# Patient Record
Sex: Female | Born: 1995
Health system: Southern US, Community
[De-identification: ages and names within clinical notes are randomized; demographics above are authoritative.]

## PROBLEM LIST (undated history)

## (undated) DIAGNOSIS — Z803 Family history of malignant neoplasm of breast: Secondary | ICD-10-CM

## (undated) DIAGNOSIS — F419 Anxiety disorder, unspecified: Secondary | ICD-10-CM

## (undated) DIAGNOSIS — D649 Anemia, unspecified: Secondary | ICD-10-CM

## (undated) DIAGNOSIS — F32A Depression, unspecified: Secondary | ICD-10-CM

## (undated) DIAGNOSIS — T7840XA Allergy, unspecified, initial encounter: Secondary | ICD-10-CM

## (undated) HISTORY — PX: NO PAST SURGERIES: SHX2092

## (undated) HISTORY — DX: Family history of malignant neoplasm of breast: Z80.3

## (undated) HISTORY — DX: Depression, unspecified: F32.A

## (undated) HISTORY — DX: Anemia, unspecified: D64.9

## (undated) HISTORY — DX: Allergy, unspecified, initial encounter: T78.40XA

## (undated) HISTORY — DX: Anxiety disorder, unspecified: F41.9

---
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
## 2015-01-19 NOTE — H&P (Signed)
L&D Evaluation:  History:  HPI Late entry from 1315  Patient is an 18yo @ 37.3 by LMP c/w 2nd trimester US with EDC of 11/30/14. Presents today to L&D with complaints of LOF.  She felt a large gush run down her leg around 11am.  Some contractions, not painful.  No VB and +FM.    Pregnancy Issues: 1. late to prenatal care @ 22w 2. + Chlamydia with neg TOC, and neg APTIMA @ 36w 3. Teen pregnancy 4. O+ RI VNI GBS neg, Hep b neg, HIV neg, RPR NR.   Presents with leaking fluid   Patient's Medical History No Chronic Illness   Patient's Surgical History none   Medications Pre Natal Vitamins   Allergies NKDA   Social History none   Family History Non-Contributory   ROS:  ROS All systems were reviewed.  HEENT, CNS, GI, GU, Respiratory, CV, Renal and Musculoskeletal systems were found to be normal.   Exam:  Vital Signs stable   Urine Protein negative dipstick   General no apparent distress   Mental Status clear   Chest clear   Heart normal sinus rhythm   Abdomen gravid, non-tender   Estimated Fetal Weight Average for gestational age   Fetal Position cephalic by leopolds   Back no CVAT   Edema no edema   Reflexes 2+   Clonus negative   Pelvic 3/70/-2, posterior and soft   Mebranes Ruptured, nitrazine + and clear fluid visible   Description clear   FHT normal rate with no decels, 140 mod + accels no decels   Ucx q ~5810min   Impression:  Impression PROM   Plan:  Comments 18yo G1P0 @ 37.3 with PROM  1. Admit to L&D for expectant management 2. Observe for ~4hrs after ROM to see if patient enters labor spontaneously, if not will start pitocin. 3. GBS negative 4. VZ non-immune, needs Varivax prior to discharge 5. light diet, IVF 6. continuous toco/efm   Electronic Signatures: Ward, Elenora Fenderhelsea C (MD)  (Signed 03-Mar-16 16:02)  Authored: L&D Evaluation   Last Updated: 03-Mar-16 16:02 by Ward, Elenora Fenderhelsea C (MD)

## 2015-10-18 IMAGING — CR DG CHEST 1V
1 series · 1 of 1 positions shown · non-contrast
Comparison: None.

CLINICAL DATA: Shortness of breath.  Cough.  Pregnant patient.

EXAM:
CHEST  1 VIEW

[w chest pa]
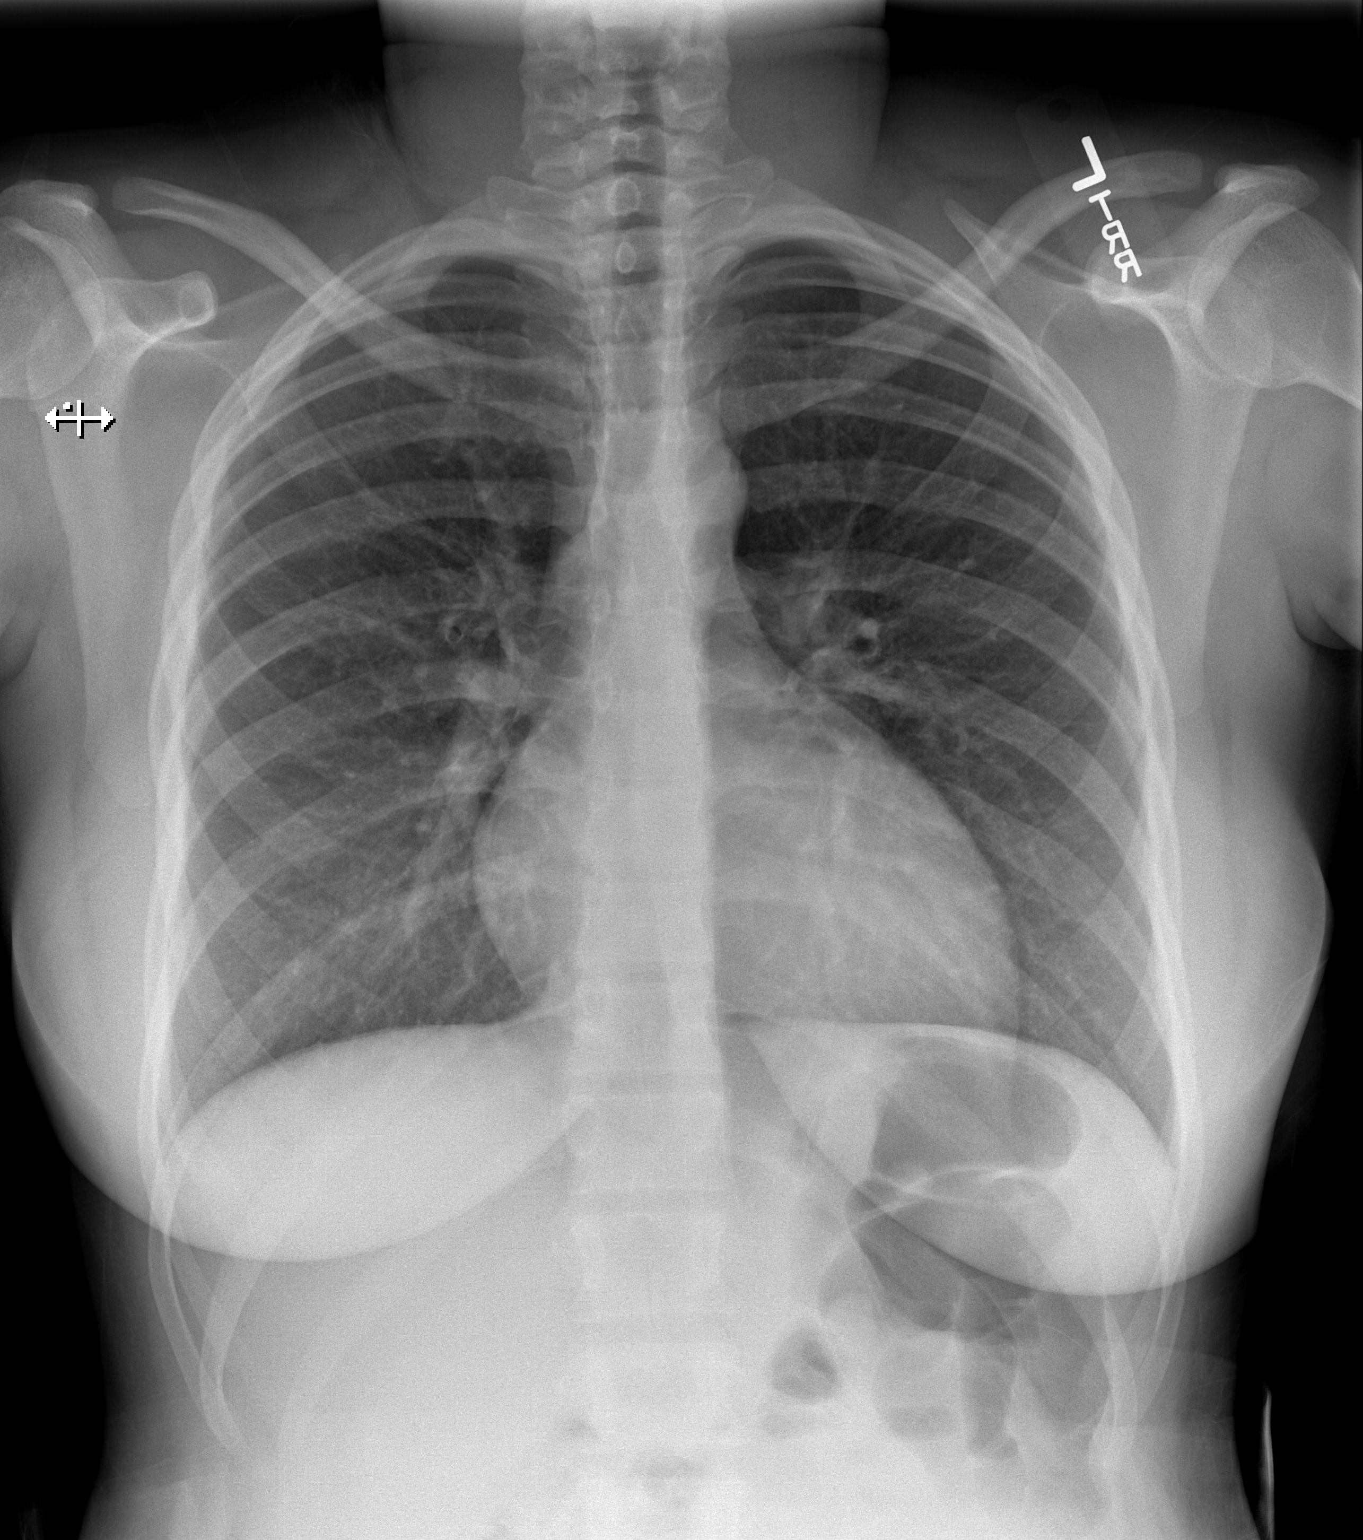

[1 of 1 positions shown; findings below may reference images not displayed]

FINDINGS: The cardiomediastinal contours are normal. The lungs are clear.
Pulmonary vasculature is normal. No consolidation, pleural effusion,
or pneumothorax. No acute osseous abnormalities are seen.
IMPRESSION: No acute pulmonary process.

## 2017-08-10 ENCOUNTER — Encounter: Payer: Self-pay | Admitting: Maternal Newborn

## 2017-08-10 ENCOUNTER — Ambulatory Visit (INDEPENDENT_AMBULATORY_CARE_PROVIDER_SITE_OTHER): Payer: BLUE CROSS/BLUE SHIELD | Admitting: Maternal Newborn

## 2017-08-10 VITALS — BP 110/60 | Ht 62.0 in | Wt 172.0 lb

## 2017-08-10 DIAGNOSIS — Z01419 Encounter for gynecological examination (general) (routine) without abnormal findings: Secondary | ICD-10-CM

## 2017-08-10 DIAGNOSIS — Z124 Encounter for screening for malignant neoplasm of cervix: Secondary | ICD-10-CM | POA: Diagnosis not present

## 2017-08-10 NOTE — Progress Notes (Signed)
Gynecology Annual Exam  PCP: Patient, No Pcp Per  Chief Complaint:  Chief Complaint  Patient presents with  . Gynecologic Exam    History of Present Illness: Patient is a 21 y.o. G1P1001 presents for annual exam. The patient has no complaints today.   LMP: No LMP recorded. Menarche:11 Cycles absent with Nexplanon. Postcoital Bleeding: no Dysmenorrhea: not applicable  The patient is sexually active. She currently uses Nexplanon for contraception. She denies dyspareunia.  The patient does not perform self breast exams.  There is no notable family history of breast or ovarian cancer in her family.  The patient wears seatbelts: yes.  The patient has regular exercise: no.    The patient denies current symptoms of depression.    Review of Systems  Constitutional: Negative for fever, malaise/fatigue and weight loss.  HENT: Negative for hearing loss, sinus pain and sore throat.   Eyes: Negative for blurred vision and pain.  Respiratory: Negative for cough, shortness of breath and wheezing.   Cardiovascular: Negative for chest pain and palpitations.  Gastrointestinal: Negative for abdominal pain, constipation, diarrhea, heartburn and nausea.  Genitourinary: Negative.   Musculoskeletal: Negative.   Skin: Negative.   Neurological: Negative for sensory change and headaches.  Endo/Heme/Allergies: Negative.   Psychiatric/Behavioral: Negative for depression. The patient is not nervous/anxious.   All other systems reviewed and are negative.   Past Medical History:  History reviewed. No pertinent past medical history.  Past Surgical History:  Past Surgical History:  Procedure Laterality Date  . NO PAST SURGERIES      Gynecologic History:  No LMP recorded. Contraception: Nexplanon Last Pap: None (not indicated due to patient's age)  Obstetric History: 531P1001  Family History:  Family History  Problem Relation Age of Onset  . Hypertension Mother   . Asthma Father   .  Breast cancer Maternal Grandmother   . Hypertension Maternal Grandmother     Social History:  Social History   Socioeconomic History  . Marital status: Single    Spouse name: Not on file  . Number of children: 1  . Years of education: 6312  . Highest education level: Not on file  Social Needs  . Financial resource strain: Not on file  . Food insecurity - worry: Not on file  . Food insecurity - inability: Not on file  . Transportation needs - medical: Not on file  . Transportation needs - non-medical: Not on file  Occupational History  . Occupation: FOOD SERVICES  Tobacco Use  . Smoking status: Never Smoker  . Smokeless tobacco: Never Used  Substance and Sexual Activity  . Alcohol use: No    Frequency: Never  . Drug use: Yes  . Sexual activity: Yes    Birth control/protection: Implant  Other Topics Concern  . Not on file  Social History Narrative  . Not on file    Allergies:  No Known Allergies  Medications: Prior to Admission medications   Medication Sig Start Date End Date Taking? Authorizing Provider  etonogestrel (NEXPLANON) 68 MG IMPL implant 1 each by Subdermal route once.   Yes [provider]    Physical Exam Vitals: Blood pressure 110/60, height 5\' 2"  (1.575 m), weight 172 lb (78 kg).  General: NAD HEENT: normocephalic, anicteric Thyroid: no enlargement, no palpable nodules Pulmonary: No increased work of breathing, CTAB Cardiovascular: RRR, distal pulses 2+ Breast: Breasts symmetrical, no tenderness, no palpable nodules or masses, no skin or nipple retraction present, no nipple discharge.  No axillary  or supraclavicular lymphadenopathy. Abdomen: NABS, soft, non-tender, non-distended.  Umbilicus without lesions.  No hepatomegaly, splenomegaly or masses palpable. No evidence of hernia  Genitourinary:  External: Normal external female genitalia.  Normal urethral  meatus, normal Bartholin's and Skene's glands.    Vagina: Normal vaginal mucosa, no  evidence of prolapse.    Cervix: Grossly normal in appearance, no bleeding  Uterus: Non-enlarged, mobile, normal contour.  No CMT  Adnexa: ovaries non-enlarged, no adnexal masses  Rectal: deferred  Lymphatic: no evidence of inguinal lymphadenopathy Extremities: no edema, erythema, or tenderness Neurologic: Grossly intact Psychiatric: mood appropriate, affect full  Assessment: 21 y.o. G1P1001, routine annual exam.  Plan: Problem List Items Addressed This Visit    None    Visit Diagnoses    Encounter for annual routine gynecological examination    -  Primary   Pap smear for cervical cancer screening       Relevant Orders   Pap IG w/ reflex to HPV when ASC-U      1) Gardasil Series discussed and if applicable offered to patient - Patient has not previously completed 3 shot series, declines now.  2) STI screening was offered and declined.  3) ASCCP guidelines and rationale discussed.  Patient opts for yearly screening interval.  4) Contraception - Education given regarding options for contraception, including oral contraceptives, Nexplanon.  Patient is interested in removing Nexplanon and starting OCPs.  She feels that the implant has caused weight gain and occasionally feels that it makes her arm sore. She will schedule an appointment for removal.  5) Follow up 1 year for routine annual exam  Marcelyn BruinsJacelyn Schmid, CNM 08/10/2017  1:34 PM

## 2017-08-13 LAB — PAP IG W/ RFLX HPV ASCU: PAP Smear Comment: 0

## 2017-08-17 ENCOUNTER — Encounter: Payer: Self-pay | Admitting: Maternal Newborn

## 2017-08-17 ENCOUNTER — Ambulatory Visit (INDEPENDENT_AMBULATORY_CARE_PROVIDER_SITE_OTHER): Payer: BLUE CROSS/BLUE SHIELD | Admitting: Maternal Newborn

## 2017-08-17 VITALS — BP 120/80 | HR 72 | Ht 62.0 in | Wt 175.0 lb

## 2017-08-17 DIAGNOSIS — Z30011 Encounter for initial prescription of contraceptive pills: Secondary | ICD-10-CM

## 2017-08-17 DIAGNOSIS — Z3046 Encounter for surveillance of implantable subdermal contraceptive: Secondary | ICD-10-CM | POA: Diagnosis not present

## 2017-08-17 MED ORDER — NORETHIN ACE-ETH ESTRAD-FE 1-20 MG-MCG(24) PO CAPS: 1.0000 | ORAL_CAPSULE | Freq: Every day | ORAL | 12 refills | Status: DC

## 2017-08-17 NOTE — Progress Notes (Signed)
     GYNECOLOGY PROCEDURE NOTE  Nexplanon removal discussed in detail.  Risks of infection, bleeding, nerve injury all reviewed.  Patient understands risks and desires to proceed.  Verbal consent obtained.  Patient is certain she wants the Nexplanon removed.  All questions answered.  Procedure: Patient placed in dorsal supine with left arm above head, elbow flexed at 90 degrees, arm resting on examination table.  Nexplanon identified without problems.  Betadine scrub x3.  1 ml of 1% lidocaine injected under device. As the anesthetic action was inadequate, made additional injections over a period of ten minutes for a total of 5 mL until adequate effect realized. Sterile gloves applied.  Small 0.5cm incision made at distal tip of implanon device with 11 blade scalpel.  Nexplanon brought to incision and grasped with a small kelly clamp.  Nexplanon removed intact after loosening local adhesions.  Pressure applied to incision.  Hemostasis obtained.  Steri-strips applied, followed by bandage and compression dressing.  Patient tolerated procedure well.     Assessment: 21 y.o. year old female now s/p Nexmplanon removal.  Plan: 1.  Patient given post procedure precautions and asked to call for fever, chills, redness or drainage from her incision, bleeding from incision.  She understands she will likely have a small bruise near site of removal and can remove bandage tomorrow and steri-strips in approximately 1 week.  2) Contraception: she desires to begin OCPs. Rx sent for Taytulla and side effects (ACHES) reviewed, as well as daily use and cycle expectations with OCPs.  Marcelyn BruinsJacelyn Verlee Pope, CNM 08/17/2017  10:26 AM

## 2017-11-20 ENCOUNTER — Encounter: Payer: Self-pay | Admitting: Maternal Newborn

## 2017-11-21 NOTE — Telephone Encounter (Signed)
Please advise 

## 2017-11-23 ENCOUNTER — Other Ambulatory Visit: Payer: Self-pay | Admitting: Maternal Newborn

## 2017-11-23 DIAGNOSIS — Z3041 Encounter for surveillance of contraceptive pills: Secondary | ICD-10-CM

## 2017-11-23 MED ORDER — NORETHIN-ETH ESTRAD-FE BIPHAS 1 MG-10 MCG / 10 MCG PO TABS: 1.0000 | ORAL_TABLET | Freq: Every day | ORAL | 3 refills | Status: DC

## 2017-11-23 NOTE — Progress Notes (Signed)
Changing OCP formulation to Lo LoEstrin as patient had problems with headaches that began when she started on Taytulla. Asked patient to advise if headaches continue on this medication as we will need to discuss alternative contraception if that is the case.  Marcelyn BruinsJacelyn Janvi Ammar, CNM 11/23/2017  1:21 PM

## 2018-06-19 ENCOUNTER — Ambulatory Visit: Payer: Self-pay | Admitting: Family Medicine

## 2018-09-11 NOTE — L&D Delivery Note (Signed)
Delivery Note At 7:34 PM a viable female was delivered via Vaginal, Spontaneous (Presentation:ROA).  APGAR:8, 9; weight pending.   Placenta status:spontnaous, intact.  Cord: 3VC, loose nuchal x 1 noted after delivery of anterior should and reduced with ease.  No complications: .  Cord pH: N/A  Anesthesia:  Epidural Episiotomy: None Lacerations:  none Suture Repair: N/A Est. Blood Loss (mL):  370mL  Mom to postpartum.  Baby to Couplet care / Skin to Skin.  Malachy Mood 08/03/2019, 7:45 PM

## 2019-01-17 ENCOUNTER — Encounter: Payer: BLUE CROSS/BLUE SHIELD | Admitting: Maternal Newborn

## 2019-01-24 ENCOUNTER — Ambulatory Visit (INDEPENDENT_AMBULATORY_CARE_PROVIDER_SITE_OTHER): Payer: BLUE CROSS/BLUE SHIELD | Admitting: Advanced Practice Midwife

## 2019-01-24 ENCOUNTER — Other Ambulatory Visit (HOSPITAL_COMMUNITY)
Admission: RE | Admit: 2019-01-24 | Discharge: 2019-01-24 | Disposition: A | Discharge status: Home / Self Care | Payer: BLUE CROSS/BLUE SHIELD | Source: Ambulatory Visit | Attending: Advanced Practice Midwife | Admitting: Advanced Practice Midwife

## 2019-01-24 ENCOUNTER — Other Ambulatory Visit: Payer: Self-pay

## 2019-01-24 ENCOUNTER — Encounter: Payer: Self-pay | Admitting: Advanced Practice Midwife

## 2019-01-24 VITALS — BP 122/74 | Wt 177.0 lb

## 2019-01-24 DIAGNOSIS — Z113 Encounter for screening for infections with a predominantly sexual mode of transmission: Secondary | ICD-10-CM | POA: Diagnosis not present

## 2019-01-24 DIAGNOSIS — Z131 Encounter for screening for diabetes mellitus: Secondary | ICD-10-CM

## 2019-01-24 DIAGNOSIS — Z3480 Encounter for supervision of other normal pregnancy, unspecified trimester: Secondary | ICD-10-CM | POA: Insufficient documentation

## 2019-01-24 DIAGNOSIS — Z3481 Encounter for supervision of other normal pregnancy, first trimester: Secondary | ICD-10-CM

## 2019-01-24 DIAGNOSIS — O99210 Obesity complicating pregnancy, unspecified trimester: Secondary | ICD-10-CM

## 2019-01-24 DIAGNOSIS — O99211 Obesity complicating pregnancy, first trimester: Secondary | ICD-10-CM

## 2019-01-24 NOTE — Patient Instructions (Addendum)
Exercise During Pregnancy For people of all ages, exercise is an important part of being healthy. Exercise improves heart and lung function and helps to maintain strength, flexibility, and a healthy body weight. Exercise also boosts energy levels and elevates mood. For most women, maintaining an exercise routine throughout pregnancy is recommended. It is only on rare occasions and with certain medical conditions or pregnancy complications that women may be asked to limit or avoid exercise during pregnancy. What are some other benefits to exercising during pregnancy? Along with maintaining strength and flexibility, exercising throughout pregnancy can help to:  Keep strength in muscles that are very important during labor and childbirth.  Decrease low back pain during pregnancy.  Decrease the risk of developing gestational diabetes mellitus (GDM).  Improve blood sugar (glucose) control for women who have GDM.  Decrease the risk of developing preeclampsia. This is a serious condition that causes high blood pressure along with other symptoms, such as swelling and headaches.  Decrease the risk of cesarean delivery.  Speed up the recovery after giving birth. How often should I exercise? Unless your health care provider gives you different instructions, you should try to exercise on most days or all days of the week. In general, try to exercise with moderate intensity for about 150 minutes per week. This can be spread out across several days, such as exercising for 30 minutes per day on 5 days of each week. You can tell that you are exercising at a moderate intensity if you have a higher heart rate and faster breathing, but you are still able to hold a conversation. What types of moderate-intensity exercise are recommended during pregnancy? There are many types of exercise that are safe for you to do during pregnancy. Unless your health care provider gives you different instructions, do a variety of  exercises that safely increase your heart and breathing (cardiopulmonary) rates and help you to build and maintain muscle strength (strength training). You should always be able to talk in full sentences while exercising during pregnancy. Some examples of exercising that is safe to do during pregnancy include:  Brisk walking or hiking.  Swimming.  Water aerobics.  Riding a stationary bike.  Strength training.  Modified yoga or Pilates. Tell your instructor that you are pregnant. Avoid overstretching and avoid lying on your back for long periods of time.  Running or jogging. Only choose this type of exercise if: ? You ran or jogged regularly before your pregnancy. ? You can run or jog and still talk in complete sentences. What types of exercise should I not do during pregnancy? Depending on your level of fitness and whether you exercised regularly before your pregnancy, you may be advised to limit vigorous-intensity exercise during your pregnancy. You can tell that you are exercising at a vigorous intensity if you are breathing much harder and faster and cannot hold a conversation while exercising. Some examples of exercising that you should avoid during pregnancy include:  Contact sports.  Activities that place you at risk for falling on or being hit in the belly, such as downhill skiing, water skiing, surfing, rock climbing, cycling, gymnastics, and horseback riding.  Scuba diving.  Sky diving.  Yoga or Pilates in a room that is heated to extreme temperatures ("hot yoga" or "hot Pilates").  Jogging or running, unless you ran or jogged regularly before your pregnancy. While jogging or running, you should always be able to talk in full sentences. Do not run or jog so vigorously that you   are unable to have a conversation.  If you are not used to exercising at elevation (more than 6,000 feet above sea level), do not do so during your pregnancy. When should I avoid exercising during  pregnancy? Certain medical conditions can make it unsafe to exercise during pregnancy, or they may increase your risk of miscarriage or early labor and birth. Some of these conditions include:  Some types of heart disease.  Some types of lung disease.  Placenta previa. This is when the placenta partially or completely covers the opening of the uterus (cervix).  Frequent bleeding from the vagina during your pregnancy.  Incompetent cervix. This is when your cervix does not remain as tightly closed during pregnancy as it should.  Premature labor.  Ruptured membranes. This is when the protective sac (amniotic sac) opens up and amniotic fluid leaks from your vagina.  Severely low blood count (anemia).  Preeclampsia or pregnancy-caused high blood pressure.  Carrying more than one baby (multiple gestation) and having an additional risk of early labor.  Poorly controlled diabetes.  Being severely underweight or severely overweight.  Intrauterine growth restriction. This is when your baby's growth and development during pregnancy are slower than expected.  Other medical conditions. Ask your health care provider if any apply to you. What else should I know about exercising during pregnancy? You should take these precautions while exercising during pregnancy:  Avoid overheating. ? Wear loose-fitting, breathable clothes. ? Do not exercise in very high temperatures.  Avoid dehydration. Drink enough water before, during, and after exercise to keep your urine clear or pale yellow.  Avoid overstretching. Because of hormone changes during pregnancy, it is easy to overstretch muscles, tendons, and ligaments during pregnancy.  Start slowly and ask your health care provider to recommend types of exercise that are safe for you, if exercising regularly is new for you. Pregnancy is not a time for exercising to lose weight. When should I seek medical care? You should stop exercising and call your  health care provider if you have any unusual symptoms, such as:  Mild uterine contractions or abdominal cramping.  Dizziness that does not improve with rest. When should I seek immediate medical care? You should stop exercising and call your local emergency services (911 in the [place] if you have any unusual symptoms, such as:  Sudden, severe pain in your low back or your belly.  Uterine contractions or abdominal cramping that do not improve with rest.  Chest pain.  Bleeding or fluid leaking from your vagina.  Shortness of breath. This information is not intended to replace advice given to you by your health care provider. Make sure you discuss any questions you have with your health care provider. Document Released: 08/28/2005 Document Revised: 01/26/2016 Document Reviewed: 11/05/2014 Elsevier Interactive Patient Education  2019 [employer] Inc. Eating Plan for Pregnant Women While you are pregnant, your body requires additional nutrition to help support your growing baby. You also have a higher need for some vitamins and minerals, such as folic acid, calcium, iron, and vitamin D. Eating a healthy, well-balanced diet is very important for your health and your baby's health. Your need for extra calories varies for the three 3-month segments of your pregnancy (trimesters). For most women, it is recommended to consume:  150 extra calories a day during the first trimester.  300 extra calories a day during the second trimester.  300 extra calories a day during the third trimester. What are tips for following this plan?   Do   not try to lose weight or go on a diet during pregnancy.  Limit your overall intake of foods that have "empty calories." These are foods that have little nutritional value, such as sweets, desserts, candies, and sugar-sweetened beverages.  Eat a variety of foods (especially fruits and vegetables) to get a full range of vitamins and minerals.  Take a prenatal vitamin  to help meet your additional vitamin and mineral needs during pregnancy, specifically for folic acid, iron, calcium, and vitamin D.  Remember to stay active. Ask your health care provider what types of exercise and activities are safe for you.  Practice good food safety and cleanliness. Wash your hands before you eat and after you prepare raw meat. Wash all fruits and vegetables well before peeling or eating. Taking these actions can help to prevent food-borne illnesses that can be very dangerous to your baby, such as listeriosis. Ask your health care provider for more information about listeriosis. What does 150 extra calories look like? Healthy options that provide 150 extra calories each day could be any of the following:  6-8 oz (170-230 g) of plain low-fat yogurt with  cup of berries.  1 apple with 2 teaspoons (11 g) of peanut butter.  Cut-up vegetables with  cup (60 g) of hummus.  8 oz (230 mL) or 1 cup of low-fat chocolate milk.  1 stick of string cheese with 1 medium orange.  1 peanut butter and jelly sandwich that is made with one slice of whole-wheat bread and 1 tsp (5 g) of peanut butter. For 300 extra calories, you could eat two of those healthy options each day. What is a healthy amount of weight to gain? The right amount of weight gain for you is based on your BMI before you became pregnant. If your BMI:  Was less than 18 (underweight), you should gain 28-40 lb (13-18 kg).  Was 18-24.9 (normal), you should gain 25-35 lb (11-16 kg).  Was 25-29.9 (overweight), you should gain 15-25 lb (7-11 kg).  Was 30 or greater (obese), you should gain 11-20 lb (5-9 kg). What if I am having twins or multiples? Generally, if you are carrying twins or multiples:  You may need to eat 300-600 extra calories a day.  The recommended range for total weight gain is 25-54 lb (11-25 kg), depending on your BMI before pregnancy.  Talk with your health care provider to find out about  nutritional needs, weight gain, and exercise that is right for you. What foods can I eat?  Grains All grains. Choose whole grains, such as whole-wheat bread, oatmeal, or brown rice. Vegetables All vegetables. Eat a variety of colors and types of vegetables. Remember to wash your vegetables well before peeling or eating. Fruits All fruits. Eat a variety of colors and types of fruit. Remember to wash your fruits well before peeling or eating. Meats and other protein foods Lean meats, including chicken, turkey, fish, and lean cuts of beef, veal, or pork. If you eat fish or seafood, choose options that are higher in omega-3 fatty acids and lower in mercury, such as salmon, herring, mussels, trout, sardines, pollock, shrimp, crab, and lobster. Tofu. Tempeh. Beans. Eggs. Peanut butter and other nut butters. Make sure that all meats, poultry, and eggs are cooked to food-safe temperatures or "well-done." Two or more servings of fish are recommended each week in order to get the most benefits from omega-3 fatty acids that are found in seafood. Choose fish that are lower in mercury. You can   find more information online:  [employer]Gaming.ch Dairy Pasteurized milk and milk alternatives (such as almond milk). Pasteurized yogurt and pasteurized cheese. Cottage cheese. Sour cream. Beverages Water. Juices that contain 100% fruit juice or vegetable juice. Caffeine-free teas and decaffeinated coffee. Drinks that contain caffeine are okay to drink, but it is better to avoid caffeine. Keep your total caffeine intake to less than 200 mg each day (which is 12 oz or 355 mL of coffee, tea, or soda) or the limit as told by your health care provider. Fats and oils Fats and oils are okay to include in moderation. Sweets and desserts Sweets and desserts are okay to include in moderation. Seasoning and other foods All pasteurized condiments. The items listed above may not be a complete list of recommended foods and beverages.  Contact your dietitian for more options. What foods are not recommended? Vegetables Raw (unpasteurized) vegetable juices. Fruits Unpasteurized fruit juices. Meats and other protein foods Lunch meats, bologna, hot dogs, or other deli meats. (If you must eat those meats, reheat them until they are steaming hot.) Refrigerated pat, meat spreads from a meat counter, smoked seafood that is found in the refrigerated section of a store. Raw or undercooked meats, poultry, and eggs. Raw fish, such as sushi or sashimi. Fish that have high mercury content, such as tilefish, shark, swordfish, and king mackerel. To learn more about mercury in fish, talk with your health care provider or look for online resources, such as:  [employer]aming.ch Dairy Raw (unpasteurized) milk and any foods that have raw milk in them. Soft cheeses, such as feta, queso blanco, queso fresco, Brie, Camembert cheeses, blue-veined cheeses, and Panela cheese (unless it is made with pasteurized milk, which must be stated on the label). Beverages Alcohol. Sugar-sweetened beverages, such as sodas, teas, or energy drinks. Seasoning and other foods Homemade fermented foods and drinks, such as pickles, sauerkraut, or kombucha drinks. (Store-bought pasteurized versions of these are okay.) Salads that are made in a store or deli, such as ham salad, chicken salad, egg salad, tuna salad, and seafood salad. The items listed above may not be a complete list of foods and beverages to avoid. Contact your dietitian for more information. Where to find more information To calculate the number of calories you need based on your height, weight, and activity level, you can use an online calculator such as:  MobileTransition.ch To calculate how much weight you should gain during pregnancy, you can use an online pregnancy weight gain calculator such as:  StreamingFood.com.cy Summary  While you are pregnant,  your body requires additional nutrition to help support your growing baby.  Eat a variety of foods, especially fruits and vegetables to get a full range of vitamins and minerals.  Practice good food safety and cleanliness. Wash your hands before you eat and after you prepare raw meat. Wash all fruits and vegetables well before peeling or eating. Taking these actions can help to prevent food-borne illnesses, such as listeriosis, that can be very dangerous to your baby.  Do not eat raw meat or fish. Do not eat fish that have high mercury content, such as tilefish, shark, swordfish, and king mackerel. Do not eat unpasteurized (raw) dairy.  Take a prenatal vitamin to help meet your additional vitamin and mineral needs during pregnancy, specifically for folic acid, iron, calcium, and vitamin D. This information is not intended to replace advice given to you by your health care provider. Make sure you discuss any questions you have with your health care  provider. Document Released: 06/12/2014 Document Revised: 05/25/2017 Document Reviewed: 05/25/2017 Elsevier Interactive Patient Education  2019 Elsevier Inc. Prenatal Care Prenatal care is health care during pregnancy. It helps you and your unborn baby (fetus) stay as healthy as possible. Prenatal care may be provided by a midwife, a family practice health care provider, or a childbirth and pregnancy specialist (obstetrician). How does this affect me? During pregnancy, you will be closely monitored for any new conditions that might develop. To lower your risk of pregnancy complications, you and your health care provider will talk about any underlying conditions you have. How does this affect my baby? Early and consistent prenatal care increases the chance that your baby will be healthy during pregnancy. Prenatal care lowers the risk that your baby will be:  Born early (prematurely).  Smaller than expected at birth (small for gestational age). What  can I expect at the first prenatal care visit? Your first prenatal care visit will likely be the longest. You should schedule your first prenatal care visit as soon as you know that you are pregnant. Your first visit is a good time to talk about any questions or concerns you have about pregnancy. At your visit, you and your health care provider will talk about:  Your medical history, including: ? Any past pregnancies. ? Your family's medical history. ? The baby's father's medical history. ? Any long-term (chronic) health conditions you have and how you manage them. ? Any surgeries or procedures you have had. ? Any current over-the-counter or prescription medicines, herbs, or supplements you are taking.  Other factors that could pose a risk to your baby, including:  Your home setting and your stress levels, including: ? Exposure to abuse or violence. ? Household financial strain. ? Mental health conditions you have.  Your daily health habits, including diet and exercise. Your health care provider will also:  Measure your weight, height, and blood pressure.  Do a physical exam, including a pelvic and breast exam.  Perform blood tests and urine tests to check for: ? Urinary tract infection. ? Sexually transmitted infections (STIs). ? Low iron levels in your blood (anemia). ? Blood type and certain proteins on red blood cells (Rh antibodies). ? Infections and immunity to viruses, such as hepatitis B and rubella. ? HIV (human immunodeficiency virus).  Do an ultrasound to confirm your baby's growth and development and to help predict your estimated due date (EDD). This ultrasound is done with a probe that is inserted into the vagina (transvaginal ultrasound).  Discuss your options for genetic screening.  Give you information about how to keep yourself and your baby healthy, including: ? Nutrition and taking vitamins. ? Physical activity. ? How to manage pregnancy symptoms such as  nausea and vomiting (morning sickness). ? Infections and substances that may be harmful to your baby and how to avoid them. ? Food safety. ? Dental care. ? Working. ? Travel. ? Warning signs to watch for and when to call your health care provider. How often will I have prenatal care visits? After your first prenatal care visit, you will have regular visits throughout your pregnancy. The visit schedule is often as follows:  Up to week 28 of pregnancy: once every 4 weeks.  28-36 weeks: once every 2 weeks.  After 36 weeks: every week until delivery. Some women may have visits more or less often depending on any underlying health conditions and the health of the baby. Keep all follow-up and prenatal care visits as told by   your health care provider. This is important. What happens during routine prenatal care visits? Your health care provider will:  Measure your weight and blood pressure.  Check for fetal heart sounds.  Measure the height of your uterus in your abdomen (fundal height). This may be measured starting around week 20 of pregnancy.  Check the position of your baby inside your uterus.  Ask questions about your diet, sleeping patterns, and whether you can feel the baby move.  Review warning signs to watch for and signs of labor.  Ask about any pregnancy symptoms you are having and how you are dealing with them. Symptoms may include: ? Headaches. ? Nausea and vomiting. ? Vaginal discharge. ? Swelling. ? Fatigue. ? Constipation. ? Any discomfort, including back or pelvic pain. Make a list of questions to ask your health care provider at your routine visits. What tests might I have during prenatal care visits? You may have blood, urine, and imaging tests throughout your pregnancy, such as:  Urine tests to check for glucose, protein, or signs of infection.  Glucose tests to check for a form of diabetes that can develop during pregnancy (gestational diabetes mellitus).  This is usually done around week 24 of pregnancy.  An ultrasound to check your baby's growth and development and to check for birth defects. This is usually done around week 20 of pregnancy.  A test to check for group B strep (GBS) infection. This is usually done around week 36 of pregnancy.  Genetic testing. This may include blood or imaging tests, such as an ultrasound. Some genetic tests are done during the first trimester and some are done during the second trimester. What else can I expect during prenatal care visits? Your health care provider may recommend getting certain vaccines during pregnancy. These may include:  A yearly flu shot (annual influenza vaccine). This is especially important if you will be pregnant during flu season.  Tdap (tetanus, diphtheria, pertussis) vaccine. Getting this vaccine during pregnancy can protect your baby from whooping cough (pertussis) after birth. This vaccine may be recommended between weeks 27 and 36 of pregnancy. Later in your pregnancy, your health care provider may give you information about:  Childbirth and breastfeeding classes.  Choosing a health care provider for your baby.  Umbilical cord banking.  Breastfeeding.  Birth control after your baby is born.  The hospital labor and delivery unit and how to tour it.  Registering at the hospital before you go into labor. Where to find more information  Office on Women's Health: TravelLesson.[place]awomenshealth.gov  American Pregnancy Association: americanpregnancy.org  March of Dimes: marchofdimes.org Summary  Prenatal care helps you and your baby stay as healthy as possible during pregnancy.  Your first prenatal care visit will most likely be the longest.  You will have visits and tests throughout your pregnancy to monitor your health and your baby's health.  Bring a list of questions to your visits to ask your health care provider.  Make sure to keep all follow-up and prenatal care visits with  your health care provider. This information is not intended to replace advice given to you by your health care provider. Make sure you discuss any questions you have with your health care provider. Document Released: 08/31/2003 Document Revised: 08/27/2017 Document Reviewed: 08/27/2017 Elsevier Interactive Patient Education  2019 [employer]    COVID-19 and Your Pregnancy FAQ  How can I prevent infection with COVID-19 during my pregnancy? Social distancing is key. Please limit any interactions in public. Try  and work from home if possible. Frequently wash your hands after touching possibly contaminated surfaces. Avoid touching your face.  Minimize trips to the store. Consider online ordering when possible.   Should I wear a mask? YES. It is recommended by the CDC that all people wear a cloth mask or facial covering in public. You should wear a mask to your visits in the office. This will help reduce transmission as well as your risk or acquiring COVID-19. New studies are showing that even asymptomatic individuals can spread the virus from talking.   Where can I get a mask? [place] and the city of [place] are partnering to provide masks to community members. You can pick up a mask from several locations. This website also has instructions about how to make a mask by sewing or without sewing by using a t-shirt or bandana.  https://www.[place].gov/i-want-to/learn-about/covid-19-information-and-updates/covid-19-face-mask-project  Studies have shown that if you were a tube or nylon stocking from pantyhose over a cloth mask it makes the cloth mask almost as effective as a N95 mask.  AntiquesInvestors.de  What are the symptoms of COVID-19? Fever (greater than 100.4 F), dry cough, shortness of breath.  Am I more at risk for COVID-19 since I am pregnant? There  is not currently data showing that pregnant women are more adversely impacted by COVID-19 than the general population. However, we know that pregnant women tend to have worse respiratory complications from similar diseases such as the flu and SARS and for this reason should be considered an at-risk population.  What do I do if I am experiencing the symptoms of COVID-19? Testing is being limited because of test availability. If you are experiencing symptoms you should quarantine yourself, and the members of your family, for at least 2 weeks at home.   Please visit this website for more information: DiscoHelp.si.html  When should I go to the Emergency Room? Please go to the emergency room if you are experiencing ANY of these symptoms*:  1.    Difficulty breathing or shortness of breath 2.    Persistent pain or pressure in the chest 3.    Confusion or difficulty being aroused (or awakened) 4.    Bluish lips or face  *This list is not all inclusive. Please consult our office for any other symptoms that are severe or concerning.  What do I do if I am having difficulty breathing? You should go to the Emergency Room for evaluation. At this time they have a tent set up for evaluating patients with COVID-19 symptoms.   How will my prenatal care be different because of the COVID-19 pandemic? It has been recommended to reduce the frequency of face-to-face visits and use resources such as telephone and virtual visits when possible. Using a scale, blood pressure machine and fetal doppler at home can further help reduce face-to-face visits. You will be provided with additional information on this topic.  We ask that you come to your visits alone to minimize potential exposures to  COVID-19.  How can I receive childbirth education? At this time in-person classes have been cancelled. You can register for online childbirth education, breastfeeding,  and newborn care classes.  Please visit:  [email or web address] for more information  How will my hospital birth experience be different? The hospital is currently limiting visitors. This means that while you are in labor you can only have one person at the hospital with you. Additional family members will not be allowed to wait in the building or outside your  room. Your one support person can be the father of the baby, a relative, a doula, or a friend. Once one support person is designated that person will wear a band. This band cannot be shared with multiple people.  Nitrous Gas is not being offered for pain relief since the tubing and filter for the machine can not be sanitized in a way to guarantee prevention of transmission of COVID-19.  Nasal cannula use of oxygen for fetal indications has also been discontinued.  Currently a clear plastic sheet is being hung between mom and the delivering provider during pushing and delivery to help prevent transmission of COVID-19.      How long will I stay in the hospital for after giving birth? It is also recommended that discharge home be expedited during the COVID-19 outbreak. This means staying for 1 day after a vaginal delivery and 2 days after a cesarean section. Patients who need to stay longer for medical reasons are allowed to do so, but the goal will be for expedited discharge home.   What if I have COVID-19 and I am in labor? We ask that you wear a mask while on labor and delivery. We will try and accommodate you being placed in a room that is capable of filtering the air. Please call ahead if you are in labor and on your way to the hospital. The phone number for labor and delivery at [facility] is [phone].  If I have COVID-19 when my baby is born how can I prevent my baby from contracting COVID-19? This is an issue that will have to be discussed on a case-by-case basis. Current recommendations suggest  providing separate isolation rooms for both the mother and new infant as well as limiting visitors. However, there are practical challenges to this recommendation. The situation will assuredly change and decisions will be influenced by the desires of the mother and availability of space.  Some suggestions are the use of a curtain or physical barrier between mom and infant, hand hygiene, mom wearing a mask, or 6 feet of spacing between a mom and infant.   Can I breastfeed during the COVID-19 pandemic?   Yes, breastfeeding is encouraged.  Can I breastfeed if I have COVID-19? Yes. Covid-19 has not been found in breast milk. This means you cannot give COVID-19 to your child through breast milk. Breast feeding will also help pass antibodies to fight infection to your baby.   What precautions should I take when breastfeeding if I have COVID-19? If a mother and newborn do room-in and the mother wishes to feed at the breast, she should put on a facemask and practice hand hygiene before each feeding.  What precautions should I take when pumping if I have COVID-19? Prior to expressing breast milk, mothers should practice hand hygiene. After each pumping session, all parts that come into contact with breast milk should be thoroughly washed and the entire pump should be appropriately disinfected per the manufacturer's instructions. This expressed breast milk should be fed to the newborn by a healthy caregiver.  What if I am pregnant and work in healthcare? Based on limited data regarding COVID-19 and pregnancy, ACOG currently does not propose creating additional restrictions on pregnant health care personnel because of COVID-19 alone. Pregnant women do not appear to be at higher risk of severe disease related to COVID-19. Pregnant health care personnel should follow CDC risk assessment and infection control guidelines for health care personnel exposed to patients with  suspected or confirmed COVID-19. Adherence to  recommended infection prevention and control practices is an important part of protecting all health care personnel in health care settings.    Information on COVID-19 in pregnancy is very limited; however, facilities may want to consider limiting exposure of pregnant health care personnel to patients with confirmed or suspected COVID-19 infection, especially during higher-risk procedures (eg, aerosol-generating procedures), if feasible, based on staffing availability.

## 2019-01-24 NOTE — Progress Notes (Signed)
New Obstetric Patient H&P    Chief Complaint: "Desires prenatal care"   History of Present Illness: Patient is a 23 y.o. [name] Not Hispanic or Latino female, presents with amenorrhea and positive home pregnancy test 3 weeks ago. No LMP recorded. Patient is pregnant. She does not know when her LMP was. Cycles are 4. days, irregular, and occur approximately every : 3-4 months. Her last pap smear was 2 years ago and was no abnormalities.    She had a urine pregnancy test which was positive 3 week(s)  ago. For the last 3-4 weeks she claims she has experienced breast tenderness, fatigue and mild nausea. She denies vaginal bleeding. Her past medical history is noncontributory. Her prior pregnancies are notable for G1 2016, FT SVD, 6 pounds  Since her LMP, she admits to the use of tobacco products  no She claims she has gained   no pounds since the start of her pregnancy.  There are cats in the home in the home  no  She admits close contact with children on a regular basis  yes  She has had chicken pox in the past no She has had Tuberculosis exposures, symptoms, or previously tested positive for TB   no Current or past history of domestic violence. no  Genetic Screening/Teratology Counseling: (Includes patient, baby's father, or anyone in either family with:)   1. Patient's age >/= 58 at [facility]  no 2. Thalassemia ([place], [place], Mediterranean, or Asian background): MCV<80  no 3. Neural tube defect (meningomyelocele, spina bifida, anencephaly)  no 4. Congenital heart defect  no  5. Down syndrome  no 6. Tay-Sachs (Jewish, [place]  no 7. Canavan's Disease  no 8. Sickle cell disease or trait (African)  no  9. Hemophilia or other blood disorders  no  10. Muscular dystrophy  no  11. Cystic fibrosis  no  12. Huntington's Chorea  no  13. Mental retardation/autism  no 14. Other inherited genetic or chromosomal disorder  no 15. Maternal metabolic disorder (DM, PKU, etc)  no 16. Patient  or FOB with a child with a birth defect not listed above no  16a. Patient or FOB with a birth defect themselves no 17. Recurrent pregnancy loss, or stillbirth  no  18. Any medications since LMP other than prenatal vitamins (include vitamins, supplements, OTC meds, drugs, alcohol)  no 19. Any other genetic/environmental exposure to discuss  no  Infection History:   1. Lives with someone with TB or TB exposed  no  2. Patient or partner has history of genital herpes  no 3. Rash or viral illness since LMP  no 4. History of STI (GC, CT, HPV, syphilis, HIV)  no 5. History of recent travel :  no  Other pertinent information:  no     Review of Systems:10 point review of systems negative unless otherwise noted in HPI  Past Medical History:  History reviewed. No pertinent past medical history.  Past Surgical History:  Past Surgical History:  Procedure Laterality Date  . NO PAST SURGERIES      Gynecologic History: No LMP recorded. Patient is pregnant.  Obstetric History: [name]  Family History:  Family History  Problem Relation Age of Onset  . Hypertension Mother   . Asthma Father   . Breast cancer Maternal Grandmother   . Hypertension Maternal Grandmother     Social History:  Social History   Socioeconomic History  . Marital status: Married    Spouse name: Not on file  .  Number of children: 1  . Years of education: 6812  . Highest education level: Not on file  Occupational History  . Occupation: FOOD SERVICES  Social Needs  . Financial resource strain: Not on file  . Food insecurity:    Worry: Not on file    Inability: Not on file  . Transportation needs:    Medical: Not on file    Non-medical: Not on file  Tobacco Use  . Smoking status: Never Smoker  . Smokeless tobacco: Never Used  Substance and Sexual Activity  . Alcohol use: No    Frequency: Never  . Drug use: No  . Sexual activity: Yes    Birth control/protection: None  Lifestyle  . Physical activity:     Days per week: Not on file    Minutes per session: Not on file  . Stress: Not on file  Relationships  . Social connections:    Talks on phone: Not on file    Gets together: Not on file    Attends religious service: Not on file    Active member of club or organization: Not on file    Attends meetings of clubs or organizations: Not on file    Relationship status: Not on file  . Intimate partner violence:    Fear of current or ex partner: Not on file    Emotionally abused: Not on file    Physically abused: Not on file    Forced sexual activity: Not on file  Other Topics Concern  . Not on file  Social History Narrative  . Not on file    Allergies:  No Known Allergies  Medications: Prior to Admission medications   Medication Sig Start Date End Date Taking? Authorizing Provider  Prenatal Vit-Fe Fumarate-FA (PRENATAL VITAMIN PO) Take by mouth.   Yes [provider]    Physical Exam Vitals: Blood pressure 122/74, weight 177 lb (80.3 kg).  General: NAD HEENT: normocephalic, anicteric Thyroid: no enlargement, no palpable nodules Pulmonary: No increased work of breathing, CTAB Cardiovascular: RRR, distal pulses 2+ Abdomen: NABS, soft, non-tender, non-distended.  Umbilicus without lesions.  No hepatomegaly, splenomegaly or masses palpable. No evidence of hernia  Genitourinary: deferred for early gestation, PAP interval Extremities: no edema, erythema, or tenderness Neurologic: Grossly intact Psychiatric: mood appropriate, affect full   Assessment: 23 y.o. [name] at Unknown presenting to initiate prenatal care  Plan: 1) Avoid alcoholic beverages. 2) Patient encouraged not to smoke.  3) Discontinue the use of all non-medicinal drugs and chemicals.  4) Take prenatal vitamins daily.  5) Nutrition, food safety (fish, cheese advisories, and high nitrite foods) and exercise discussed. 6) Hospital and practice style discussed with cross coverage system.  7) Genetic  Screening, such as with 1st Trimester Screening, cell free fetal DNA, AFP testing, and Ultrasound, as well as with amniocentesis and CVS as appropriate, is discussed with patient. At the conclusion of today's visit patient requested 1st trimester screen genetic testing 8) Patient is asked about travel to areas at risk for the Zika virus, and counseled to avoid travel and exposure to mosquitoes or sexual partners who may have themselves been exposed to the virus. Testing is discussed, and will be ordered as appropriate. 9) Urine culture and urine aptima today 10) Return to clinic in 1 week for dating scan, early 1 hour gtt, NOB panel and Sickle Cell panel   Tresea MallJane Jaber Dunlow, CNM [facility] 01/24/2019, 10:43 AM

## 2019-01-24 NOTE — Progress Notes (Signed)
NOB today.  Pt does not know when LMP was

## 2019-01-26 LAB — URINE CULTURE

## 2019-01-28 LAB — CERVICOVAGINAL ANCILLARY ONLY
Chlamydia: NEGATIVE
Neisseria Gonorrhea: NEGATIVE
Trichomonas: POSITIVE — AB

## 2019-01-30 ENCOUNTER — Encounter: Payer: BLUE CROSS/BLUE SHIELD | Admitting: Maternal Newborn

## 2019-01-31 ENCOUNTER — Encounter: Payer: Self-pay | Admitting: Maternal Newborn

## 2019-01-31 ENCOUNTER — Other Ambulatory Visit: Payer: Self-pay | Admitting: Advanced Practice Midwife

## 2019-01-31 ENCOUNTER — Ambulatory Visit (INDEPENDENT_AMBULATORY_CARE_PROVIDER_SITE_OTHER): Payer: BLUE CROSS/BLUE SHIELD

## 2019-01-31 ENCOUNTER — Ambulatory Visit (INDEPENDENT_AMBULATORY_CARE_PROVIDER_SITE_OTHER): Payer: BLUE CROSS/BLUE SHIELD | Admitting: Maternal Newborn

## 2019-01-31 ENCOUNTER — Other Ambulatory Visit: Payer: Self-pay

## 2019-01-31 ENCOUNTER — Other Ambulatory Visit: Payer: BLUE CROSS/BLUE SHIELD

## 2019-01-31 VITALS — BP 120/60 | Wt 179.0 lb

## 2019-01-31 DIAGNOSIS — Z131 Encounter for screening for diabetes mellitus: Secondary | ICD-10-CM | POA: Diagnosis not present

## 2019-01-31 DIAGNOSIS — Z3A12 12 weeks gestation of pregnancy: Secondary | ICD-10-CM

## 2019-01-31 DIAGNOSIS — A599 Trichomoniasis, unspecified: Secondary | ICD-10-CM

## 2019-01-31 DIAGNOSIS — O99210 Obesity complicating pregnancy, unspecified trimester: Secondary | ICD-10-CM

## 2019-01-31 DIAGNOSIS — Z3682 Encounter for antenatal screening for nuchal translucency: Secondary | ICD-10-CM

## 2019-01-31 DIAGNOSIS — Z3687 Encounter for antenatal screening for uncertain dates: Secondary | ICD-10-CM | POA: Diagnosis not present

## 2019-01-31 DIAGNOSIS — Z3480 Encounter for supervision of other normal pregnancy, unspecified trimester: Secondary | ICD-10-CM | POA: Diagnosis not present

## 2019-01-31 DIAGNOSIS — Z3481 Encounter for supervision of other normal pregnancy, first trimester: Secondary | ICD-10-CM

## 2019-01-31 LAB — POCT URINALYSIS DIPSTICK OB
Glucose, UA: NEGATIVE
POC,PROTEIN,UA: NEGATIVE

## 2019-01-31 LAB — OB RESULTS CONSOLE VARICELLA ZOSTER ANTIBODY, IGG: Varicella: NON-IMMUNE/NOT IMMUNE

## 2019-01-31 MED ORDER — METRONIDAZOLE 500 MG PO TABS: 500.0000 mg | ORAL_TABLET | Freq: Two times a day (BID) | ORAL | 0 refills | Status: AC

## 2019-01-31 NOTE — Progress Notes (Signed)
Rx Metronidazole sent to patient pharmacy. Patient aware of diagnosis/treatment.

## 2019-01-31 NOTE — Patient Instructions (Signed)
First Trimester of Pregnancy  The first trimester of pregnancy is from week 1 until the end of week 13 (months 1 through 3). A week after a sperm fertilizes an egg, the egg will implant on the wall of the uterus. This embryo will begin to develop into a baby. Genes from you and your partner will form the baby. The female genes will determine whether the baby will be a boy or a girl. At 6-8 weeks, the eyes and face will be formed, and the heartbeat can be seen on ultrasound. At the end of 12 weeks, all the baby's organs will be formed.  Now that you are pregnant, you will want to do everything you can to have a healthy baby. Two of the most important things are to get good prenatal care and to follow your health care provider's instructions. Prenatal care is all the medical care you receive before the baby's birth. This care will help prevent, find, and treat any problems during the pregnancy and childbirth.  Body changes during your first trimester  Your body goes through many changes during pregnancy. The changes vary from woman to woman.   You may gain or lose a couple of pounds at first.   You may feel sick to your stomach (nauseous) and you may throw up (vomit). If the vomiting is uncontrollable, call your health care provider.   You may tire easily.   You may develop headaches that can be relieved by medicines. All medicines should be approved by your health care provider.   You may urinate more often. Painful urination may mean you have a bladder infection.   You may develop heartburn as a result of your pregnancy.   You may develop constipation because certain hormones are causing the muscles that push stool through your intestines to slow down.   You may develop hemorrhoids or swollen veins (varicose veins).   Your breasts may begin to grow larger and become tender. Your nipples may stick out more, and the tissue that surrounds them (areola) may become darker.   Your gums may bleed and may be  sensitive to brushing and flossing.   Dark spots or blotches (chloasma, mask of pregnancy) may develop on your face. This will likely fade after the baby is born.   Your menstrual periods will stop.   You may have a loss of appetite.   You may develop cravings for certain kinds of food.   You may have changes in your emotions from day to day, such as being excited to be pregnant or being concerned that something may go wrong with the pregnancy and baby.   You may have more vivid and strange dreams.   You may have changes in your hair. These can include thickening of your hair, rapid growth, and changes in texture. Some women also have hair loss during or after pregnancy, or hair that feels dry or thin. Your hair will most likely return to normal after your baby is born.  What to expect at prenatal visits  During a routine prenatal visit:   You will be weighed to make sure you and the baby are growing normally.   Your blood pressure will be taken.   Your abdomen will be measured to track your baby's growth.   The fetal heartbeat will be listened to between weeks 10 and 14 of your pregnancy.   Test results from any previous visits will be discussed.  Your health care provider may ask you:     How you are feeling.   If you are feeling the baby move.   If you have had any abnormal symptoms, such as leaking fluid, bleeding, severe headaches, or abdominal cramping.   If you are using any tobacco products, including cigarettes, chewing tobacco, and electronic cigarettes.   If you have any questions.  Other tests that may be performed during your first trimester include:   Blood tests to find your blood type and to check for the presence of any previous infections. The tests will also be used to check for low iron levels (anemia) and protein on red blood cells (Rh antibodies). Depending on your risk factors, or if you previously had diabetes during pregnancy, you may have tests to check for high blood sugar  that affects pregnant women (gestational diabetes).   Urine tests to check for infections, diabetes, or protein in the urine.   An ultrasound to confirm the proper growth and development of the baby.   Fetal screens for spinal cord problems (spina bifida) and Down syndrome.   HIV (human immunodeficiency virus) testing. Routine prenatal testing includes screening for HIV, unless you choose not to have this test.   You may need other tests to make sure you and the baby are doing well.  Follow these instructions at home:  Medicines   Follow your health care provider's instructions regarding medicine use. Specific medicines may be either safe or unsafe to take during pregnancy.   Take a prenatal vitamin that contains at least 600 micrograms (mcg) of folic acid.   If you develop constipation, try taking a stool softener if your health care provider approves.  Eating and drinking     Eat a balanced diet that includes fresh fruits and vegetables, whole grains, good sources of protein such as meat, eggs, or tofu, and low-fat dairy. Your health care provider will help you determine the amount of weight gain that is right for you.   Avoid raw meat and uncooked cheese. These carry germs that can cause birth defects in the baby.   Eating four or five small meals rather than three large meals a day may help relieve nausea and vomiting. If you start to feel nauseous, eating a few soda crackers can be helpful. Drinking liquids between meals, instead of during meals, also seems to help ease nausea and vomiting.   Limit foods that are high in fat and processed sugars, such as fried and sweet foods.   To prevent constipation:  ? Eat foods that are high in fiber, such as fresh fruits and vegetables, whole grains, and beans.  ? Drink enough fluid to keep your urine clear or pale yellow.  Activity   Exercise only as directed by your health care provider. Most women can continue their usual exercise routine during  pregnancy. Try to exercise for 30 minutes at least 5 days a week. Exercising will help you:  ? Control your weight.  ? Stay in shape.  ? Be prepared for labor and delivery.   Experiencing pain or cramping in the lower abdomen or lower back is a good sign that you should stop exercising. Check with your health care provider before continuing with normal exercises.   Try to avoid standing for long periods of time. Move your legs often if you must stand in one place for a long time.   Avoid heavy lifting.   Wear low-heeled shoes and practice good posture.   You may continue to have sex unless your health care   provider tells you not to.  Relieving pain and discomfort   Wear a good support bra to relieve breast tenderness.   Take warm sitz baths to soothe any pain or discomfort caused by hemorrhoids. Use hemorrhoid cream if your health care provider approves.   Rest with your legs elevated if you have leg cramps or low back pain.   If you develop varicose veins in your legs, wear support hose. Elevate your feet for 15 minutes, 3-4 times a day. Limit salt in your diet.  Prenatal care   Schedule your prenatal visits by the twelfth week of pregnancy. They are usually scheduled monthly at first, then more often in the last 2 months before delivery.   Write down your questions. Take them to your prenatal visits.   Keep all your prenatal visits as told by your health care provider. This is important.  Safety   Wear your seat belt at all times when driving.   Make a list of emergency phone numbers, including numbers for family, friends, the hospital, and police and fire departments.  General instructions   Ask your health care provider for a referral to a local prenatal education class. Begin classes no later than the beginning of month 6 of your pregnancy.   Ask for help if you have counseling or nutritional needs during pregnancy. Your health care provider can offer advice or refer you to specialists for help  with various needs.   Do not use hot tubs, steam rooms, or saunas.   Do not douche or use tampons or scented sanitary pads.   Do not cross your legs for long periods of time.   Avoid cat litter boxes and soil used by cats. These carry germs that can cause birth defects in the baby and possibly loss of the fetus by miscarriage or stillbirth.   Avoid all smoking, herbs, alcohol, and medicines not prescribed by your health care provider. Chemicals in these products affect the formation and growth of the baby.   Do not use any products that contain nicotine or tobacco, such as cigarettes and e-cigarettes. If you need help quitting, ask your health care provider. You may receive counseling support and other resources to help you quit.   Schedule a dentist appointment. At home, brush your teeth with a soft toothbrush and be gentle when you floss.  Contact a health care provider if:   You have dizziness.   You have mild pelvic cramps, pelvic pressure, or nagging pain in the abdominal area.   You have persistent nausea, vomiting, or diarrhea.   You have a bad smelling vaginal discharge.   You have pain when you urinate.   You notice increased swelling in your face, hands, legs, or ankles.   You are exposed to fifth disease or chickenpox.   You are exposed to German measles (rubella) and have never had it.  Get help right away if:   You have a fever.   You are leaking fluid from your vagina.   You have spotting or bleeding from your vagina.   You have severe abdominal cramping or pain.   You have rapid weight gain or loss.   You vomit blood or material that looks like coffee grounds.   You develop a severe headache.   You have shortness of breath.   You have any kind of trauma, such as from a fall or a car accident.  Summary   The first trimester of pregnancy is from week 1 until   the end of week 13 (months 1 through 3).   Your body goes through many changes during pregnancy. The changes vary from  woman to woman.   You will have routine prenatal visits. During those visits, your health care provider will examine you, discuss any test results you may have, and talk with you about how you are feeling.  This information is not intended to replace advice given to you by your health care provider. Make sure you discuss any questions you have with your health care provider.  Document Released: 08/22/2001 Document Revised: 08/09/2016 Document Reviewed: 08/09/2016  Elsevier Interactive Patient Education  2019 [employer]

## 2019-01-31 NOTE — Progress Notes (Signed)
ROB/Dating/Glucola- no concerns

## 2019-01-31 NOTE — Progress Notes (Signed)
    Routine Prenatal Care Visit  Subjective  Mary Schwartz is a 23 y.o. [name] at [redacted]w[redacted]d being seen today for ongoing prenatal care.  She is currently monitored for the following issues for this low-risk pregnancy and has Supervision of other normal pregnancy, antepartum on their problem list.  ----------------------------------------------------------------------------------- Patient reports no complaints.   Vag. Bleeding: None.  No leaking of fluid.  ----------------------------------------------------------------------------------- The following portions of the patient's history were reviewed and updated as appropriate: allergies, current medications, past family history, past medical history, past social history, past surgical history and problem list. Problem list updated.  Objective  Blood pressure 120/60, weight 179 lb (81.2 kg). Pregravid weight 177 lb (80.3 kg) Total Weight Gain 2 lb (0.907 kg) Urinalysis: Urine dipstick shows negative for glucose, protein. Fetal Status: Fetal Heart Rate (bpm): 145         General:  Alert, oriented and cooperative. Patient is in no acute distress.  Skin: Skin is warm and dry. No rash noted.   Cardiovascular: Normal heart rate noted  Respiratory: Normal respiratory effort, no problems with respiration noted  Abdomen: Soft, gravid, appropriate for gestational age. Pain/Pressure: Absent     Pelvic:  Cervical exam deferred        Extremities: Normal range of motion.     Mental Status: Normal mood and affect. Normal behavior. Normal judgment and thought content.     Assessment   23 y.o. [name] at [redacted]w[redacted]d, EDD 08/13/2019 by Ultrasound presenting for a routine prenatal visit.  Plan   pregnancy Problems (from 01/24/19 to present)    Problem Noted Resolved   Supervision of other normal pregnancy, antepartum 01/24/2019 by Tresea Mall, CNM No   Overview Addendum 01/31/2019  9:01 AM by Oswaldo Conroy, CNM    [facility]inic [facility]renatal [facility]   Dating  Blood type:     Genetic Screen 1 Screen:    AFP:     Quad:     NIPS: Antibody:   Anatomic [place]  Rubella:   Varicella: @VZVIGG @  GTT Early:               Third trimester:  RPR:     Rhogam  HBsAg:     TDaP vaccine                       Flu Shot: HIV:     Baby Food Plans to initiate breastfeeding                               GBS:   Contraception  Pap: 08/10/2017, NILM  CBB     CS/VBAC NA   Support Person [name]           Ultrasound today shows singleton IUP at 12w 2d. Arcuate appearance of uterus noted.  Early glucola and labs today. She will return next week for NT scan and first trimester screen.  Please refer to After Visit Summary for other counseling recommendations.   Return in about 3 days (around 02/03/2019) for ROB with NT/First trimester screen.  Marcelyn Bruins, CNM 01/31/2019  9:17 AM

## 2019-02-01 LAB — GLUCOSE, 1 HOUR GESTATIONAL: Gestational Diabetes Screen: 110 mg/dL (ref 65–139)

## 2019-02-04 ENCOUNTER — Encounter: Payer: BLUE CROSS/BLUE SHIELD | Admitting: Obstetrics and Gynecology

## 2019-02-04 ENCOUNTER — Other Ambulatory Visit: Payer: BLUE CROSS/BLUE SHIELD

## 2019-02-04 LAB — HEMOGLOBINOPATHY EVALUATION
HGB C: 0 %
HGB S: 0 %
HGB VARIANT: 0 %
Hemoglobin A2 Quantitation: 2 % (ref 1.8–3.2)
Hemoglobin F Quantitation: 0 % (ref 0.0–2.0)
Hgb A: 98 % (ref 96.4–98.8)

## 2019-02-04 LAB — RPR+RH+ABO+RUB AB+AB SCR+CB...
Antibody Screen: NEGATIVE
HIV Screen 4th Generation wRfx: NONREACTIVE
Hematocrit: 37.2 % (ref 34.0–46.6)
Hemoglobin: 11.8 g/dL (ref 11.1–15.9)
Hepatitis B Surface Ag: NEGATIVE
MCH: 26.5 pg — ABNORMAL LOW (ref 26.6–33.0)
MCHC: 31.7 g/dL (ref 31.5–35.7)
MCV: 83 fL (ref 79–97)
Platelets: 262 10*3/uL (ref 150–450)
RBC: 4.46 x10E6/uL (ref 3.77–5.28)
RDW: 13.7 % (ref 11.7–15.4)
RPR Ser Ql: NONREACTIVE
Rh Factor: POSITIVE
Rubella Antibodies, IGG: 2.05 index (ref >0.99)
Varicella zoster IgG: <135 index — ABNORMAL LOW (ref >165)
WBC: 9.3 10*3/uL (ref 3.4–10.8)

## 2019-02-07 ENCOUNTER — Encounter: Payer: Self-pay | Admitting: Obstetrics and Gynecology

## 2019-02-07 ENCOUNTER — Other Ambulatory Visit: Payer: Self-pay

## 2019-02-07 ENCOUNTER — Ambulatory Visit (INDEPENDENT_AMBULATORY_CARE_PROVIDER_SITE_OTHER): Payer: BLUE CROSS/BLUE SHIELD | Admitting: Obstetrics and Gynecology

## 2019-02-07 VITALS — BP 112/66 | Wt 178.0 lb

## 2019-02-07 DIAGNOSIS — B9689 Other specified bacterial agents as the cause of diseases classified elsewhere: Secondary | ICD-10-CM

## 2019-02-07 DIAGNOSIS — Z3A13 13 weeks gestation of pregnancy: Secondary | ICD-10-CM

## 2019-02-07 DIAGNOSIS — N760 Acute vaginitis: Secondary | ICD-10-CM

## 2019-02-07 DIAGNOSIS — Z3480 Encounter for supervision of other normal pregnancy, unspecified trimester: Secondary | ICD-10-CM

## 2019-02-07 LAB — POCT URINALYSIS DIPSTICK OB: Glucose, UA: NEGATIVE

## 2019-02-07 MED ORDER — METRONIDAZOLE 0.75 % VA GEL: 1.0000 | Freq: Every day | VAGINAL | 1 refills | Status: AC

## 2019-02-07 NOTE — Progress Notes (Signed)
    Routine Prenatal Care Visit  Subjective  Mary Schwartz is a 23 y.o. [name] at [redacted]w[redacted]d being seen today for ongoing prenatal care.  She is currently monitored for the following issues for this low-risk pregnancy and has Supervision of other normal pregnancy, antepartum on their problem list.  ----------------------------------------------------------------------------------- Patient reports no complaints.  Was not able to tolerate oral flagyl. Changed her mind and declines genetic screening today. Contractions: Not present. Vag. Bleeding: None.  Movement: Absent. Denies leaking of fluid.  ----------------------------------------------------------------------------------- The following portions of the patient's history were reviewed and updated as appropriate: allergies, current medications, past family history, past medical history, past social history, past surgical history and problem list. Problem list updated.   Objective  Blood pressure 112/66, weight 178 lb (80.7 kg). Pregravid weight 177 lb (80.3 kg) Total Weight Gain 1 lb (0.454 kg) Urinalysis:      Fetal Status: Fetal Heart Rate (bpm): 150   Movement: Absent     General:  Alert, oriented and cooperative. Patient is in no acute distress.  Skin: Skin is warm and dry. No rash noted.   Cardiovascular: Normal heart rate noted  Respiratory: Normal respiratory effort, no problems with respiration noted  Abdomen: Soft, gravid, appropriate for gestational age. Pain/Pressure: Absent     Pelvic:  Cervical exam deferred        Extremities: Normal range of motion.     Mental Status: Normal mood and affect. Normal behavior. Normal judgment and thought content.     Assessment   23 y.o. [name] at [redacted]w[redacted]d by  08/13/2019, by Ultrasound presenting for routine prenatal visit  Plan   pregnancy Problems (from 01/24/19 to present)    Problem Noted Resolved   Supervision of other normal pregnancy, antepartum 01/24/2019 by Tresea Mall, CNM  No   Overview Addendum 02/07/2019 10:40 AM by Natale Milch, MD    [facility]inic [facility] Prenatal [facility]  Dating Ultrasound at [redacted]w[redacted]d Blood type: O/Positive/-- (05/22 0928)   Genetic Screen Declines Antibody:Negative (05/22 0928)  Anatomic [place]  Rubella: 2.05 (05/22 1610) Varicella: NONIMMUNE  GTT Early:               Third trimester:  RPR: Non Reactive (05/22 0928)   Rhogam  not needed HBsAg: Negative (05/22 0928)   TDaP vaccine                       Flu Shot: HIV: Non Reactive (05/22 0928)   Baby Food Plans to initiate breastfeeding                               GBS:   Contraception  Pap: 08/10/2017, NILM  CBB     CS/VBAC NA   Support Person [name]              Gestational age appropriate obstetric precautions including but not limited to vaginal bleeding, contractions, leaking of fluid and fetal movement were reviewed in detail with the patient.    Rx for metrogel sent Declines genetic screening for the pregnancy  Return in about 3 weeks (around 02/28/2019) for ROB on phone.  Natale Milch MD [facility], [facility] 02/07/2019, 10:42 AM

## 2019-02-07 NOTE — Progress Notes (Signed)
ROB Discuss Flagyl pills (Can't swallow)

## 2019-02-28 ENCOUNTER — Ambulatory Visit (INDEPENDENT_AMBULATORY_CARE_PROVIDER_SITE_OTHER): Payer: BC Managed Care – PPO | Admitting: Obstetrics and Gynecology

## 2019-02-28 DIAGNOSIS — Z3A16 16 weeks gestation of pregnancy: Secondary | ICD-10-CM

## 2019-02-28 DIAGNOSIS — Z3480 Encounter for supervision of other normal pregnancy, unspecified trimester: Secondary | ICD-10-CM

## 2019-02-28 DIAGNOSIS — Z3482 Encounter for supervision of other normal pregnancy, second trimester: Secondary | ICD-10-CM

## 2019-02-28 NOTE — Progress Notes (Signed)
Routine Prenatal Care Visit- Virtual Visit  Subjective   Virtual Visit via Telephone Note  I connected with Mary Schwartz on 02/28/19 at 11:10 AM EDT by telephone and verified that I am speaking with the correct person using two identifiers.   I discussed the limitations, risks, security and privacy concerns of performing an evaluation and management service by telephone and the availability of in person appointments. I also discussed with the patient that there may be a patient responsible charge related to this service. The patient expressed understanding and agreed to proceed.  The patient was at home I spoke with the patient from my  office The names of people involved in this encounter were: Deon Pilling and Dr. Gilman Schmidt.   Mary Schwartz is a 23 y.o. [name] at [redacted]w[redacted]d being seen today for ongoing prenatal care.  She is currently monitored for the following issues for this low-risk pregnancy and has Supervision of other normal pregnancy, antepartum on their problem list.  ----------------------------------------------------------------------------------- Patient reports no complaints.    .  .   . Denies leaking of fluid.  ----------------------------------------------------------------------------------- The following portions of the patient's history were reviewed and updated as appropriate: allergies, current medications, past family history, past medical history, past social history, past surgical history and problem list. Problem list updated.   Objective  There were no vitals taken for this visit. Pregravid weight 177 lb (80.3 kg) Total Weight Gain 1 lb (0.454 kg) Urinalysis:      Fetal Status:           Physical Exam could not be performed. Because of the COVID-19 outbreak this visit was performed over the phone and not in person.   Assessment   23 y.o. [name] at [redacted]w[redacted]d by  08/13/2019, by Ultrasound presenting for routine prenatal visit  Plan   pregnancy  Problems (from 01/24/19 to present)    Problem Noted Resolved   Supervision of other normal pregnancy, antepartum 01/24/2019 by Rod Can, CNM No   Overview Addendum 02/07/2019 10:40 AM by Homero Fellers, MD    [facility]  Dating Ultrasound at [redacted]w[redacted]d Blood type: O/Positive/-- (05/22 0928)   Genetic Screen Declines Antibody:Negative (05/22 0928)  Anatomic [place]  Rubella: 2.05 (05/22 4076) Varicella: NONIMMUNE  GTT Early:               Third trimester:  RPR: Non Reactive (05/22 0928)   Rhogam  not needed HBsAg: Negative (05/22 0928)   TDaP vaccine                       Flu Shot: HIV: Non Reactive (05/22 0928)   Baby Food Plans to initiate breastfeeding                               GBS:   Contraception  Pap: 08/10/2017, NILM  CBB     CS/VBAC NA   Support Person [name]              Gestational age appropriate obstetric precautions including but not limited to vaginal bleeding, contractions, leaking of fluid and fetal movement were reviewed in detail with the patient.    Doing well- reports using a home doppler and checking the heart beat, it is very strong.  Feeling better after using the metrogel.  Follow Up Instructions: Follow up in 4 weeks for anatomy US   I discussed the assessment and  treatment plan with the patient. The patient was provided an opportunity to ask questions and all were answered. The patient agreed with the plan and demonstrated an understanding of the instructions.   The patient was advised to call back or seek an in-person evaluation if the symptoms worsen or if the condition fails to improve as anticipated.  I provided 10 minutes of non-face-to-face time during this encounter.  No follow-ups on file.  Adelene Idlerhristanna Schuman MD [facility], [facility] 02/28/2019 11:26 AM

## 2019-02-28 NOTE — Progress Notes (Signed)
ROB- since yesterday morning, pt has been feeling pressure on lower part of stomach

## 2019-03-28 ENCOUNTER — Ambulatory Visit (INDEPENDENT_AMBULATORY_CARE_PROVIDER_SITE_OTHER): Payer: BC Managed Care – PPO

## 2019-03-28 ENCOUNTER — Other Ambulatory Visit: Payer: Self-pay

## 2019-03-28 ENCOUNTER — Ambulatory Visit (INDEPENDENT_AMBULATORY_CARE_PROVIDER_SITE_OTHER): Payer: BC Managed Care – PPO | Admitting: Maternal Newborn

## 2019-03-28 ENCOUNTER — Encounter: Payer: Self-pay | Admitting: Maternal Newborn

## 2019-03-28 VITALS — BP 124/78 | Wt 176.0 lb

## 2019-03-28 DIAGNOSIS — Z363 Encounter for antenatal screening for malformations: Secondary | ICD-10-CM | POA: Diagnosis not present

## 2019-03-28 DIAGNOSIS — Z3480 Encounter for supervision of other normal pregnancy, unspecified trimester: Secondary | ICD-10-CM

## 2019-03-28 DIAGNOSIS — Z3A20 20 weeks gestation of pregnancy: Secondary | ICD-10-CM

## 2019-03-28 DIAGNOSIS — Z3482 Encounter for supervision of other normal pregnancy, second trimester: Secondary | ICD-10-CM

## 2019-03-28 NOTE — Progress Notes (Signed)
U/s today. No vb. No lof.  

## 2019-03-28 NOTE — Patient Instructions (Signed)

## 2019-03-28 NOTE — Progress Notes (Signed)
    Routine Prenatal Care Visit  Subjective  Mary Schwartz is a 23 y.o. [name] at [redacted]w[redacted]d being seen today for ongoing prenatal care.  She is currently monitored for the following issues for this low-risk pregnancy and has Supervision of other normal pregnancy, antepartum on their problem list.  ----------------------------------------------------------------------------------- Patient reports difficulty sleeping due to back pain and round ligament pain. It is hard for her to find a comfortable position while in bed. She is also having some headaches at work when she is in front of the computer for long periods. Contractions: Not present. Vag. Bleeding: None.  Movement: Present. No leaking of fluid.  ----------------------------------------------------------------------------------- The following portions of the patient's history were reviewed and updated as appropriate: allergies, current medications, past family history, past medical history, past social history, past surgical history and problem list. Problem list updated.   Objective  Blood pressure 124/78, weight 176 lb (79.8 kg). Pregravid weight 177 lb (80.3 kg) Total Weight Gain -1 lb (-0.454 kg)  Fetal Status: Fetal Heart Rate (bpm): 145 ([place])   Movement: Present     General:  Alert, oriented and cooperative. Patient is in no acute distress.  Skin: Skin is warm and dry. No rash noted.   Cardiovascular: Normal heart rate noted  Respiratory: Normal respiratory effort, no problems with respiration noted  Abdomen: Soft, gravid, appropriate for gestational age. Pain/Pressure: Present     Pelvic:  Cervical exam deferred        Extremities: Normal range of motion.  Edema: None  Mental Status: Normal mood and affect. Normal behavior. Normal judgment and thought content.     Assessment   23 y.o. [name] at [redacted]w[redacted]d, EDD 08/13/2019 by Ultrasound presenting for a routine prenatal visit.  Plan   pregnancy Problems (from 01/24/19 to  present)    Problem Noted Resolved   Supervision of other normal pregnancy, antepartum 01/24/2019 by Rod Can, CNM No   Overview Addendum 02/07/2019 10:40 AM by Homero Fellers, MD    [facility]renatal [facility]  Dating Ultrasound at [redacted]w[redacted]d Blood type: O/Positive/-- (05/22 0928)   Genetic Screen Declines Antibody:Negative (05/22 0928)  Anatomic [place]  Rubella: 2.05 (05/22 9024) Varicella: NONIMMUNE  GTT Early:               Third trimester:  RPR: Non Reactive (05/22 0928)   Rhogam  not needed HBsAg: Negative (05/22 0928)   TDaP vaccine                       Flu Shot: HIV: Non Reactive (05/22 0928)   Baby Food Plans to initiate breastfeeding                               GBS:   Contraception  Pap: 08/10/2017, NILM  CBB     CS/VBAC NA   Support Person [name]           Normal anatomy scan today with FHR at 145 and subjectively normal AFI. Results reviewed with patient.   Preterm labor symptoms and general obstetric precautions including but not limited to vaginal bleeding, contractions, leaking of fluid and fetal movement were reviewed.  Please refer to After Visit Summary for other counseling recommendations.   Return in about 4 weeks (around 04/25/2019) for [place].  Avel Sensor, CNM 03/28/2019  9:36 AM

## 2019-04-25 ENCOUNTER — Other Ambulatory Visit: Payer: Self-pay

## 2019-04-25 ENCOUNTER — Ambulatory Visit (INDEPENDENT_AMBULATORY_CARE_PROVIDER_SITE_OTHER): Payer: BC Managed Care – PPO | Admitting: Maternal Newborn

## 2019-04-25 ENCOUNTER — Encounter: Payer: Self-pay | Admitting: Maternal Newborn

## 2019-04-25 VITALS — BP 100/60 | Wt 180.8 lb

## 2019-04-25 DIAGNOSIS — Z3480 Encounter for supervision of other normal pregnancy, unspecified trimester: Secondary | ICD-10-CM

## 2019-04-25 DIAGNOSIS — Z3A24 24 weeks gestation of pregnancy: Secondary | ICD-10-CM

## 2019-04-25 DIAGNOSIS — Z3482 Encounter for supervision of other normal pregnancy, second trimester: Secondary | ICD-10-CM

## 2019-04-25 LAB — POCT URINALYSIS DIPSTICK OB
Glucose, UA: NEGATIVE
POC,PROTEIN,UA: NEGATIVE

## 2019-04-25 NOTE — Progress Notes (Signed)
    Routine Prenatal Care Visit  Subjective  Mary Schwartz is a 23 y.o. [name] at [redacted]w[redacted]d being seen today for ongoing prenatal care.  She is currently monitored for the following issues for this low-risk pregnancy and has Supervision of other normal pregnancy, antepartum on their problem list.  ----------------------------------------------------------------------------------- Patient reports no complaints.   Contractions: Not present. Vag. Bleeding: None.  Movement: Present. No leaking of fluid.  ----------------------------------------------------------------------------------- The following portions of the patient's history were reviewed and updated as appropriate: allergies, current medications, past family history, past medical history, past social history, past surgical history and problem list. Problem list updated.   Objective  Blood pressure 100/60, weight 180 lb 12.8 oz (82 kg). Pregravid weight 177 lb (80.3 kg) Total Weight Gain 3 lb 12.8 oz (1.724 kg) Urinalysis: Urine dipstick shows negative for glucose, protein.  Fetal Status: Fetal Heart Rate (bpm): 151 Fundal Height: 25 cm Movement: Present     General:  Alert, oriented and cooperative. Patient is in no acute distress.  Skin: Skin is warm and dry. No rash noted.   Cardiovascular: Normal heart rate noted  Respiratory: Normal respiratory effort, no problems with respiration noted  Abdomen: Soft, gravid, appropriate for gestational age. Pain/Pressure: Absent     Pelvic:  Cervical exam deferred        Extremities: Normal range of motion.  Edema: None  Mental Status: Normal mood and affect. Normal behavior. Normal judgment and thought content.     Assessment   23 y.o. [name] at [redacted]w[redacted]d, EDD 08/13/2019 by Ultrasound presenting for a routine prenatal visit.  Plan   pregnancy Problems (from 01/24/19 to present)    Problem Noted Resolved   Supervision of other normal pregnancy, antepartum 01/24/2019 by Rod Can, CNM  No   Overview Addendum 03/28/2019 10:27 AM by Rexene Agent, [place] Prenatal Labs  Dating Ultrasound at [redacted]w[redacted]d Blood type: O/Positive/-- (05/22 0928)   Genetic Screen Declines Antibody:Negative (05/22 0928)  Anatomic [place] Complete 7/17 Rubella: 2.05 (05/22 8341) Varicella: NONIMMUNE  GTT Early:               Third trimester:  RPR: Non Reactive (05/22 0928)   Rhogam  not needed HBsAg: Negative (05/22 0928)   TDaP vaccine                       Flu Shot: HIV: Non Reactive (05/22 0928)   Baby Food Plans to initiate breastfeeding                               GBS:   Contraception  Pap: 08/10/2017, NILM  CBB     CS/VBAC NA   Support Person [name]           GTT/labs next visit.   Please refer to After Visit Summary for other counseling recommendations.   Return in about 4 weeks (around 05/23/2019) for ROB and GTT/labs.  Avel Sensor, CNM 04/25/2019  10:21 AM

## 2019-04-25 NOTE — Progress Notes (Signed)
ROB- no concerns 

## 2019-04-25 NOTE — Patient Instructions (Signed)

## 2019-05-05 ENCOUNTER — Telehealth: Payer: Self-pay

## 2019-05-05 NOTE — Telephone Encounter (Addendum)
Pt reports she is having really bad back pain. She has been taking tylenol. She has a pregnancy pillow she uses at work and at home. She has a heating pad that she also uses at home, but is unable to use at work. She is inquiring if there is anything else she can do to decrease the pain. IF#[phone]

## 2019-05-05 NOTE — Telephone Encounter (Signed)
Spoke w/patient. She reports she has been using a belly band and taking tyelnol for about a month now. She has used a back support at work prior to and during pregnancy. She uses a pregnancy pillow at home which helps very little. She works at a call center and sits all day. States the pain is lower to mid back and does feel it is muscular. Pt denies any UTI s&s. Advised will send to provider for review for any further suggestions.

## 2019-05-05 NOTE — Telephone Encounter (Signed)
Patient aware.

## 2019-05-05 NOTE — Telephone Encounter (Signed)
She can try muscle rub such as Biofreeze or any brand she can get OTC. Also a heating pad at home (but not after using the muscle rub to avoid skin burns and be careful not to fall asleep with the heating pad on).

## 2019-05-23 ENCOUNTER — Ambulatory Visit (INDEPENDENT_AMBULATORY_CARE_PROVIDER_SITE_OTHER): Payer: BC Managed Care – PPO | Admitting: Certified Nurse Midwife

## 2019-05-23 ENCOUNTER — Other Ambulatory Visit: Payer: Self-pay

## 2019-05-23 ENCOUNTER — Other Ambulatory Visit: Payer: BC Managed Care – PPO

## 2019-05-23 VITALS — BP 110/60 | Wt 186.0 lb

## 2019-05-23 DIAGNOSIS — Z3480 Encounter for supervision of other normal pregnancy, unspecified trimester: Secondary | ICD-10-CM

## 2019-05-23 DIAGNOSIS — Z3483 Encounter for supervision of other normal pregnancy, third trimester: Secondary | ICD-10-CM

## 2019-05-23 DIAGNOSIS — Z3A28 28 weeks gestation of pregnancy: Secondary | ICD-10-CM

## 2019-05-23 LAB — POCT URINALYSIS DIPSTICK OB
Glucose, UA: NEGATIVE
POC,PROTEIN,UA: NEGATIVE

## 2019-05-23 NOTE — Progress Notes (Signed)
C/o back problems; nausea. rj

## 2019-05-24 LAB — 28 WEEK RH+PANEL
Basophils Absolute: 0 10*3/uL (ref 0.0–0.2)
Basos: 0 %
EOS (ABSOLUTE): 0.1 10*3/uL (ref 0.0–0.4)
Eos: 1 %
Gestational Diabetes Screen: 116 mg/dL (ref 65–139)
HIV Screen 4th Generation wRfx: NONREACTIVE
Hematocrit: 31.8 % — ABNORMAL LOW (ref 34.0–46.6)
Hemoglobin: 10.5 g/dL — ABNORMAL LOW (ref 11.1–15.9)
Immature Grans (Abs): 0.1 10*3/uL (ref 0.0–0.1)
Immature Granulocytes: 1 %
Lymphocytes Absolute: 1.3 10*3/uL (ref 0.7–3.1)
Lymphs: 14 %
MCH: 27.1 pg (ref 26.6–33.0)
MCHC: 33 g/dL (ref 31.5–35.7)
MCV: 82 fL (ref 79–97)
Monocytes Absolute: 0.4 10*3/uL (ref 0.1–0.9)
Monocytes: 4 %
Neutrophils Absolute: 7.5 10*3/uL — ABNORMAL HIGH (ref 1.4–7.0)
Neutrophils: 80 %
Platelets: 271 10*3/uL (ref 150–450)
RBC: 3.87 x10E6/uL (ref 3.77–5.28)
RDW: 13.9 % (ref 11.7–15.4)
RPR Ser Ql: NONREACTIVE
WBC: 9.4 10*3/uL (ref 3.4–10.8)

## 2019-05-26 ENCOUNTER — Encounter: Payer: Self-pay | Admitting: Certified Nurse Midwife

## 2019-05-26 NOTE — Progress Notes (Signed)
ROB at 28 wk 2 days. Having 28 week labs today. Baby active. Back pain between scapulae.   FHTs WNL. FH 28 cm Discussed comfort measures for thoracic back pain. Breast feeding ROB in 2 weeks O POS blood type. Rhogam not indicated.  Dalia Heading, CNM

## 2019-06-06 ENCOUNTER — Encounter: Payer: Self-pay | Admitting: Obstetrics & Gynecology

## 2019-06-06 ENCOUNTER — Other Ambulatory Visit: Payer: Self-pay

## 2019-06-06 ENCOUNTER — Ambulatory Visit (INDEPENDENT_AMBULATORY_CARE_PROVIDER_SITE_OTHER): Payer: BC Managed Care – PPO | Admitting: Obstetrics & Gynecology

## 2019-06-06 VITALS — BP 120/80 | Wt 186.0 lb

## 2019-06-06 DIAGNOSIS — Z3480 Encounter for supervision of other normal pregnancy, unspecified trimester: Secondary | ICD-10-CM

## 2019-06-06 DIAGNOSIS — Z3A30 30 weeks gestation of pregnancy: Secondary | ICD-10-CM

## 2019-06-06 DIAGNOSIS — Z3483 Encounter for supervision of other normal pregnancy, third trimester: Secondary | ICD-10-CM

## 2019-06-06 DIAGNOSIS — Z23 Encounter for immunization: Secondary | ICD-10-CM

## 2019-06-06 LAB — POCT URINALYSIS DIPSTICK OB: Glucose, UA: NEGATIVE

## 2019-06-06 NOTE — Progress Notes (Signed)
  Subjective  Fetal Movement? yes Contractions? no Leaking Fluid? no Vaginal Bleeding? no  Objective  BP 120/80   Wt 186 lb (84.4 kg)   LMP  (LMP Unknown)   BMI 34.02 kg/m  General: NAD Pumonary: no increased work of breathing Abdomen: gravid, non-tender Extremities: no edema Psychiatric: mood appropriate, affect full  Assessment  23 y.o. [name] at [redacted]w[redacted]d by  08/13/2019, by Ultrasound presenting for routine prenatal visit  Plan   Problem List Items Addressed This Visit      Other   Supervision of other normal pregnancy, antepartum    Other Visit Diagnoses    [redacted] weeks gestation of pregnancy    -  Primary   Relevant Orders   POC Urinalysis Dipstick OB (Completed)      pregnancy Problems (from 01/24/19 to present)    Problem Noted Resolved   Supervision of other normal pregnancy, antepartum 01/24/2019 by Rod Can, CNM No   Overview Addendum 06/06/2019 10:23 AM by Gae Dry, MD    [facility]inic [facility] Prenatal [facility]  Dating Ultrasound at [redacted]w[redacted]d Blood type: O/Positive/-- (05/22 0928)   Genetic Screen Declines Antibody:Negative (05/22 0928)  Anatomic [place] Complete 7/17 Rubella: 2.05 (05/22 0928) Varicella: NONIMMUNE  GTT   Third trimester: 116 RPR: Non Reactive (05/22 0928)   Rhogam  not needed HBsAg: Negative (05/22 0928)   TDaP vaccine  9/25       Flu Shot:declines HIV: Non Reactive (05/22 0928)   Baby Food Plans to initiate breastfeeding                               GBS: p  Contraception Unsure today Pap: 08/10/2017, NILM  CBB  No   CS/VBAC NA   Support Person [name]            TDaP today  Declines flu shot  Barnett Applebaum, MD, [facility] 06/06/2019  10:27 AM

## 2019-06-06 NOTE — Addendum Note (Signed)
Addended by: Quintella Baton D on: 06/06/2019 10:34 AM   Modules accepted: Orders

## 2019-06-06 NOTE — Patient Instructions (Signed)
Third Trimester of Pregnancy The third trimester is from week 28 through week 40 (months 7 through 9). The third trimester is a time when the unborn baby (fetus) is growing rapidly. At the end of the ninth month, the fetus is about 20 inches in length and weighs 6-10 pounds. Body changes during your third trimester Your body will continue to go through many changes during pregnancy. The changes vary from woman to woman. During the third trimester:  Your weight will continue to increase. You can expect to gain 25-35 pounds (11-16 kg) by the end of the pregnancy.  You may begin to get stretch marks on your hips, abdomen, and breasts.  You may urinate more often because the fetus is moving lower into your pelvis and pressing on your bladder.  You may develop or continue to have heartburn. This is caused by increased hormones that slow down muscles in the digestive tract.  You may develop or continue to have constipation because increased hormones slow digestion and cause the muscles that push waste through your intestines to relax.  You may develop hemorrhoids. These are swollen veins (varicose veins) in the rectum that can itch or be painful.  You may develop swollen, bulging veins (varicose veins) in your legs.  You may have increased body aches in the pelvis, back, or thighs. This is due to weight gain and increased hormones that are relaxing your joints.  You may have changes in your hair. These can include thickening of your hair, rapid growth, and changes in texture. Some women also have hair loss during or after pregnancy, or hair that feels dry or thin. Your hair will most likely return to normal after your baby is born.  Your breasts will continue to grow and they will continue to become tender. A yellow fluid (colostrum) may leak from your breasts. This is the first milk you are producing for your baby.  Your belly button may stick out.  You may notice more swelling in your hands,  face, or ankles.  You may have increased tingling or numbness in your hands, arms, and legs. The skin on your belly may also feel numb.  You may feel short of breath because of your expanding uterus.  You may have more problems sleeping. This can be caused by the size of your belly, increased need to urinate, and an increase in your body's metabolism.  You may notice the fetus "dropping," or moving lower in your abdomen (lightening).  You may have increased vaginal discharge.  You may notice your joints feel loose and you may have pain around your pelvic bone. What to expect at prenatal visits You will have prenatal exams every 2 weeks until week 36. Then you will have weekly prenatal exams. During a routine prenatal visit:  You will be weighed to make sure you and the baby are growing normally.  Your blood pressure will be taken.  Your abdomen will be measured to track your baby's growth.  The fetal heartbeat will be listened to.  Any test results from the previous visit will be discussed.  You may have a cervical check near your due date to see if your cervix has softened or thinned (effaced).  You will be tested for Group B streptococcus. This happens between 35 and 37 weeks. Your health care provider may ask you:  What your birth plan is.  How you are feeling.  If you are feeling the baby move.  If you have had any abnormal   symptoms, such as leaking fluid, bleeding, severe headaches, or abdominal cramping.  If you are using any tobacco products, including cigarettes, chewing tobacco, and electronic cigarettes.  If you have any questions. Other tests or screenings that may be performed during your third trimester include:  Blood tests that check for low iron levels (anemia).  Fetal testing to check the health, activity level, and growth of the fetus. Testing is done if you have certain medical conditions or if there are problems during the pregnancy.  Nonstress test  (NST). This test checks the health of your baby to make sure there are no signs of problems, such as the baby not getting enough oxygen. During this test, a belt is placed around your belly. The baby is made to move, and its heart rate is monitored during movement. What is false labor? False labor is a condition in which you feel small, irregular tightenings of the muscles in the womb (contractions) that usually go away with rest, changing position, or drinking water. These are called Braxton Hicks contractions. Contractions may last for hours, days, or even weeks before true labor sets in. If contractions come at regular intervals, become more frequent, increase in intensity, or become painful, you should see your health care provider. What are the signs of labor?  Abdominal cramps.  Regular contractions that start at 10 minutes apart and become stronger and more frequent with time.  Contractions that start on the top of the uterus and spread down to the lower abdomen and back.  Increased pelvic pressure and dull back pain.  A watery or bloody mucus discharge that comes from the vagina.  Leaking of amniotic fluid. This is also known as your "water breaking." It could be a slow trickle or a gush. Let your health care provider know if it has a color or strange odor. If you have any of these signs, call your health care provider right away, even if it is before your due date. Follow these instructions at home: Medicines  Follow your health care provider's instructions regarding medicine use. Specific medicines may be either safe or unsafe to take during pregnancy.  Take a prenatal vitamin that contains at least 600 micrograms (mcg) of folic acid.  If you develop constipation, try taking a stool softener if your health care provider approves. Eating and drinking   Eat a balanced diet that includes fresh fruits and vegetables, whole grains, good sources of protein such as meat, eggs, or tofu,  and low-fat dairy. Your health care provider will help you determine the amount of weight gain that is right for you.  Avoid raw meat and uncooked cheese. These carry germs that can cause birth defects in the baby.  If you have low calcium intake from food, talk to your health care provider about whether you should take a daily calcium supplement.  Eat four or five small meals rather than three large meals a day.  Limit foods that are high in fat and processed sugars, such as fried and sweet foods.  To prevent constipation: ? Drink enough fluid to keep your urine clear or pale yellow. ? Eat foods that are high in fiber, such as fresh fruits and vegetables, whole grains, and beans. Activity  Exercise only as directed by your health care provider. Most women can continue their usual exercise routine during pregnancy. Try to exercise for 30 minutes at least 5 days a week. Stop exercising if you experience uterine contractions.  Avoid heavy lifting.  Do   not exercise in extreme heat or humidity, or at high altitudes.  Wear low-heel, comfortable shoes.  Practice good posture.  You may continue to have sex unless your health care provider tells you otherwise. Relieving pain and discomfort  Take frequent breaks and rest with your legs elevated if you have leg cramps or low back pain.  Take warm sitz baths to soothe any pain or discomfort caused by hemorrhoids. Use hemorrhoid cream if your health care provider approves.  Wear a good support bra to prevent discomfort from breast tenderness.  If you develop varicose veins: ? Wear support pantyhose or compression stockings as told by your healthcare provider. ? Elevate your feet for 15 minutes, 3-4 times a day. Prenatal care  Write down your questions. Take them to your prenatal visits.  Keep all your prenatal visits as told by your health care provider. This is important. Safety  Wear your seat belt at all times when driving.  Make  a list of emergency phone numbers, including numbers for family, friends, the hospital, and police and fire departments. General instructions  Avoid cat litter boxes and soil used by cats. These carry germs that can cause birth defects in the baby. If you have a cat, ask someone to clean the litter box for you.  Do not travel far distances unless it is absolutely necessary and only with the approval of your health care provider.  Do not use hot tubs, steam rooms, or saunas.  Do not drink alcohol.  Do not use any products that contain nicotine or tobacco, such as cigarettes and e-cigarettes. If you need help quitting, ask your health care provider.  Do not use any medicinal herbs or unprescribed drugs. These chemicals affect the formation and growth of the baby.  Do not douche or use tampons or scented sanitary pads.  Do not cross your legs for long periods of time.  To prepare for the arrival of your baby: ? Take prenatal classes to understand, practice, and ask questions about labor and delivery. ? Make a trial run to the hospital. ? Visit the hospital and tour the maternity area. ? Arrange for maternity or paternity leave through employers. ? Arrange for family and friends to take care of pets while you are in the hospital. ? Purchase a rear-facing car seat and make sure you know how to install it in your car. ? Pack your hospital bag. ? Prepare the baby's nursery. Make sure to remove all pillows and stuffed animals from the baby's crib to prevent suffocation.  Visit your dentist if you have not gone during your pregnancy. Use a soft toothbrush to brush your teeth and be gentle when you floss. Contact a health care provider if:  You are unsure if you are in labor or if your water has broken.  You become dizzy.  You have mild pelvic cramps, pelvic pressure, or nagging pain in your abdominal area.  You have lower back pain.  You have persistent nausea, vomiting, or diarrhea.   You have an unusual or bad smelling vaginal discharge.  You have pain when you urinate. Get help right away if:  Your water breaks before 37 weeks.  You have regular contractions less than 5 minutes apart before 37 weeks.  You have a fever.  You are leaking fluid from your vagina.  You have spotting or bleeding from your vagina.  You have severe abdominal pain or cramping.  You have rapid weight loss or weight gain.  You have   shortness of breath with chest pain.  You notice sudden or extreme swelling of your face, hands, ankles, feet, or legs.  Your baby makes fewer than 10 movements in 2 hours.  You have severe headaches that do not go away when you take medicine.  You have vision changes. Summary  The third trimester is from week 28 through week 40, months 7 through 9. The third trimester is a time when the unborn baby (fetus) is growing rapidly.  During the third trimester, your discomfort may increase as you and your baby continue to gain weight. You may have abdominal, leg, and back pain, sleeping problems, and an increased need to urinate.  During the third trimester your breasts will keep growing and they will continue to become tender. A yellow fluid (colostrum) may leak from your breasts. This is the first milk you are producing for your baby.  False labor is a condition in which you feel small, irregular tightenings of the muscles in the womb (contractions) that eventually go away. These are called Braxton Hicks contractions. Contractions may last for hours, days, or even weeks before true labor sets in.  Signs of labor can include: abdominal cramps; regular contractions that start at 10 minutes apart and become stronger and more frequent with time; watery or bloody mucus discharge that comes from the vagina; increased pelvic pressure and dull back pain; and leaking of amniotic fluid. This information is not intended to replace advice given to you by your health  care provider. Make sure you discuss any questions you have with your health care provider. Document Released: 08/22/2001 Document Revised: 12/19/2018 Document Reviewed: 10/03/2016 Elsevier Patient Education  2020 [employer]  

## 2019-06-18 ENCOUNTER — Other Ambulatory Visit: Payer: Self-pay

## 2019-06-18 ENCOUNTER — Ambulatory Visit (INDEPENDENT_AMBULATORY_CARE_PROVIDER_SITE_OTHER): Payer: BC Managed Care – PPO | Admitting: Advanced Practice Midwife

## 2019-06-18 ENCOUNTER — Encounter: Payer: Self-pay | Admitting: Advanced Practice Midwife

## 2019-06-18 VITALS — BP 118/74 | Wt 185.0 lb

## 2019-06-18 DIAGNOSIS — Z3A32 32 weeks gestation of pregnancy: Secondary | ICD-10-CM

## 2019-06-18 DIAGNOSIS — Z3483 Encounter for supervision of other normal pregnancy, third trimester: Secondary | ICD-10-CM

## 2019-06-18 NOTE — Progress Notes (Signed)
  Routine Prenatal Care Visit  Subjective  Mary Schwartz is a 23 y.o. [name] at [redacted]w[redacted]d being seen today for ongoing prenatal care.  She is currently monitored for the following issues for this low-risk pregnancy and has Supervision of other normal pregnancy, antepartum on their problem list.  ----------------------------------------------------------------------------------- Patient reports no complaints.   Contractions: Not present. Vag. Bleeding: None.  Movement: Present. Leaking Fluid denies.  ----------------------------------------------------------------------------------- The following portions of the patient's history were reviewed and updated as appropriate: allergies, current medications, past family history, past medical history, past social history, past surgical history and problem list. Problem list updated.  Objective  Blood pressure 118/74, weight 185 lb (83.9 kg). Pregravid weight 177 lb (80.3 kg) Total Weight Gain 8 lb (3.629 kg) Urinalysis: Urine Protein    Urine Glucose    Fetal Status: Fetal Heart Rate (bpm): 143 Fundal Height: 32 cm Movement: Present     General:  Alert, oriented and cooperative. Patient is in no acute distress.  Skin: Skin is warm and dry. No rash noted.   Cardiovascular: Normal heart rate noted  Respiratory: Normal respiratory effort, no problems with respiration noted  Abdomen: Soft, gravid, appropriate for gestational age. Pain/Pressure: Absent     Pelvic:  Cervical exam deferred        Extremities: Normal range of motion.  Edema: None  Mental Status: Normal mood and affect. Normal behavior. Normal judgment and thought content.   Assessment   23 y.o. [name] at [redacted]w[redacted]d by  08/13/2019, by Ultrasound presenting for routine prenatal visit  Plan   pregnancy Problems (from 01/24/19 to present)    Problem Noted Resolved   Supervision of other normal pregnancy, antepartum 01/24/2019 by Rod Can, CNM No   Overview Addendum 06/06/2019 10:23 AM  by Gae Dry, MD    [facility]inic [facility] Prenatal Labs  Dating Ultrasound at [redacted]w[redacted]d Blood type: O/Positive/-- (05/22 0928)   Genetic Screen Declines Antibody:Negative (05/22 0928)  Anatomic [place] Complete 7/17 Rubella: 2.05 (05/22 0928) Varicella: NONIMMUNE  GTT   Third trimester: 116 RPR: Non Reactive (05/22 0928)   Rhogam  not needed HBsAg: Negative (05/22 0928)   TDaP vaccine  9/25       Flu Shot:declines HIV: Non Reactive (05/22 0928)   Baby Food Plans to initiate breastfeeding                               GBS:   Contraception  Pap: 08/10/2017, NILM  CBB  No   CS/VBAC NA   Support Person [name]              Preterm labor symptoms and general obstetric precautions including but not limited to vaginal bleeding, contractions, leaking of fluid and fetal movement were reviewed in detail with the patient.    Return in about 2 weeks (around 07/02/2019) for rob.  Rod Can, CNM 06/18/2019 11:15 AM

## 2019-06-18 NOTE — Progress Notes (Signed)
No vb. No lof.  

## 2019-06-27 ENCOUNTER — Emergency Department
Admission: EM | Admit: 2019-06-27 | Discharge: 2019-06-27 | Disposition: A | Discharge status: Home / Self Care | Payer: BC Managed Care – PPO | Attending: Emergency Medicine | Admitting: Emergency Medicine

## 2019-06-27 ENCOUNTER — Encounter: Payer: Self-pay | Admitting: *Deleted

## 2019-06-27 ENCOUNTER — Other Ambulatory Visit: Payer: Self-pay

## 2019-06-27 DIAGNOSIS — M546 Pain in thoracic spine: Secondary | ICD-10-CM | POA: Insufficient documentation

## 2019-06-27 DIAGNOSIS — Z3A33 33 weeks gestation of pregnancy: Secondary | ICD-10-CM | POA: Diagnosis not present

## 2019-06-27 DIAGNOSIS — O26893 Other specified pregnancy related conditions, third trimester: Secondary | ICD-10-CM | POA: Diagnosis not present

## 2019-06-27 MED ORDER — LIDOCAINE 4 % EX PTCH: 1.0000 | MEDICATED_PATCH | Freq: Every day | CUTANEOUS | 0 refills | Status: DC

## 2019-06-27 MED ORDER — CYCLOBENZAPRINE HCL 10 MG PO TABS: 5.0000 mg | ORAL_TABLET | Freq: Every day | ORAL | 0 refills | Status: AC | PRN

## 2019-06-27 NOTE — Discharge Instructions (Signed)
Please try using the lidocaine patches and Tylenol before taking Flexeril.  Please take the Flexeril only if the pain in your back is severe.    Follow up with your OB as scheduled or sooner if needed.  Return to the ER for symptoms of concern if unable to schedule an appointment.

## 2019-06-27 NOTE — ED Notes (Signed)
First Nurse Note: Pt to ED via POV, ambulatory without difficulty, c/o upper back pain. Pt is in NAD. Pt is [redacted] week pregnant. Denies Lower back pain

## 2019-06-27 NOTE — ED Provider Notes (Signed)
[facility] Emergency Department Provider Note ____________________________________________  Time seen: Approximately 1:30 PM  I have reviewed the triage vital signs and the nursing notes.   HISTORY  Chief Complaint Back Pain    HPI Mary Schwartz is a 23 y.o. female who presents to the emergency department for evaluation and treatment of upper back pain. Patient is [redacted] weeks pregnant. She is wearing an abdominal/back support brace but it doesn't help the upper back pain. She was unable to see her OB today and pain is worse. She denies shortness of breath or chest pain.   History reviewed. No pertinent past medical history.  Patient Active Problem List   Diagnosis Date Noted  . Supervision of other normal pregnancy, antepartum 01/24/2019    Past Surgical History:  Procedure Laterality Date  . NO PAST SURGERIES      Prior to Admission medications   Medication Sig Start Date End Date Taking? Authorizing Provider  cyclobenzaprine (FLEXERIL) 10 MG tablet Take 0.5 tablets (5 mg total) by mouth daily as needed for up to 10 days for muscle spasms. 06/27/19 07/07/19  Edwinna Rochette, Johnette Abraham B, FNP  Lidocaine 4 % PTCH Apply 1 patch topically daily. 06/27/19   Monterio Bob, Dessa Phi, FNP  Prenatal Vit-Fe Fumarate-FA (PRENATAL VITAMIN PO) Take by mouth.    [provider]    Allergies Patient has no known allergies.  Family History  Problem Relation Age of Onset  . Hypertension Mother   . Asthma Father   . Breast cancer Maternal Grandmother   . Hypertension Maternal Grandmother     Social History Social History   Tobacco Use  . Smoking status: Never Smoker  . Smokeless tobacco: Never Used  Substance Use Topics  . Alcohol use: No    Frequency: Never  . Drug use: No    Review of Systems Constitutional: Negative for fever. Cardiovascular: Negative for chest pain. Respiratory: Negative for shortness of breath. Musculoskeletal: Positive for upper back  pain. Skin: Negative for open wounds or lesions.  Neurological: Negative for decrease in sensation  ____________________________________________   PHYSICAL EXAM:  VITAL SIGNS: ED Triage Vitals  Enc Vitals Group     BP 06/27/19 1302 130/81     Pulse Rate 06/27/19 1302 96     Resp 06/27/19 1302 16     Temp 06/27/19 1302 98.7 F (37.1 C)     Temp Source 06/27/19 1302 Oral     SpO2 06/27/19 1302 100 %     Weight 06/27/19 1304 186 lb (84.4 kg)     Height 06/27/19 1304 5\' 2"  (1.575 m)     Head Circumference --      Peak Flow --      Pain Score 06/27/19 1304 4     Pain Loc --      Pain Edu? --      Excl. in [place]? --     Constitutional: Alert and oriented. Well appearing and in no acute distress. Eyes: Conjunctivae are clear without discharge or drainage Head: Atraumatic Neck: Supple Respiratory: No cough. Respirations are even and unlabored. Musculoskeletal: Focal tenderness over the upper thoracic muscles. Neurologic: Awake, alert, and oriented.  Skin: Intact.  Psychiatric: Affect and behavior are appropriate.  ____________________________________________   LABS (all labs ordered are listed, but only abnormal results are displayed)  Labs Reviewed - No data to display ____________________________________________  RADIOLOGY  Not indicated. ____________________________________________   PROCEDURES  Procedures  ____________________________________________   INITIAL IMPRESSION / ASSESSMENT AND PLAN /  ED COURSE  Mary Schwartz is a 23 y.o. who presents to the emergency department for evaluation of thoracic back pain.  She is [redacted] weeks pregnant but denies any concern with pregnancy.  She states that the baby is very active and she is not having any lower back pain, feeling of contractions, vaginal bleeding or discharge.  She states that she has had this pain the entire pregnancy but it has just gotten worse over the past couple of days.  She states this happened to her  in her last pregnancy as well and they were able to provide her with a muscle relaxer to be used as needed.  She will be given a prescription for 5 mg Flexeril to be taken only once per day if needed.  She was also advised to continue taking the Tylenol and using the brace.  She was encouraged to follow-up with her OB/GYN as scheduled next week.  She was encouraged to return to the emergency department for any symptom changes or worsens if she is unable to schedule an earlier appointment.  Medications - No data to display  Pertinent labs & imaging results that were available during my care of the patient were reviewed by me and considered in my medical decision making (see chart for details).  _________________________________________   FINAL CLINICAL IMPRESSION(S) / ED DIAGNOSES  Final diagnoses:  Acute midline thoracic back pain    ED Discharge Orders         Ordered    cyclobenzaprine (FLEXERIL) 10 MG tablet  Daily PRN     06/27/19 1343    Lidocaine 4 % PTCH  Daily     06/27/19 1343           If controlled substance prescribed during this visit, 12 month history viewed on the [facility] prior to issuing an initial prescription for Schedule II or III opiod.   Chinita Pester, FNP 06/28/19 2037    Arnaldo Natal, MD 06/29/19 1245

## 2019-06-27 NOTE — ED Notes (Signed)
Pt ambulatory from triage with steady gait, NAD noted, A&Ox4.

## 2019-06-27 NOTE — ED Triage Notes (Signed)
Patient c/o mid to upper back pain. Patient states she has tried heating pads and warm showers without relief. Patient is 33 weeks, 2 day pregnant. Patient states the back pain has been present entire pregnancy, but states back support used at work is no longer working. Patient denies any problems with pregnancy, feels baby moving appropriately, does not have contractions or discharge.

## 2019-07-02 ENCOUNTER — Ambulatory Visit (INDEPENDENT_AMBULATORY_CARE_PROVIDER_SITE_OTHER): Payer: BC Managed Care – PPO | Admitting: Advanced Practice Midwife

## 2019-07-02 ENCOUNTER — Other Ambulatory Visit: Payer: Self-pay

## 2019-07-02 ENCOUNTER — Encounter: Payer: Self-pay | Admitting: Advanced Practice Midwife

## 2019-07-02 VITALS — BP 102/60 | Wt 191.0 lb

## 2019-07-02 DIAGNOSIS — M549 Dorsalgia, unspecified: Secondary | ICD-10-CM

## 2019-07-02 DIAGNOSIS — O26893 Other specified pregnancy related conditions, third trimester: Secondary | ICD-10-CM

## 2019-07-02 DIAGNOSIS — Z3A34 34 weeks gestation of pregnancy: Secondary | ICD-10-CM

## 2019-07-02 NOTE — Progress Notes (Signed)
  Routine Prenatal Care Visit  Subjective  Mary Schwartz is a 23 y.o. [name] at [redacted]w[redacted]d being seen today for ongoing prenatal care.  She is currently monitored for the following issues for this low-risk pregnancy and has Supervision of other normal pregnancy, antepartum on their problem list.  ----------------------------------------------------------------------------------- Patient reports Flexeril at bedtime is helping with back pain and allowing her to sleep. Heat and a new back pillow at work are also helping. She plans to go out of work around 36 weeks.   Contractions: Not present. Vag. Bleeding: None.  Movement: Present. Leaking Fluid denies.  ----------------------------------------------------------------------------------- The following portions of the patient's history were reviewed and updated as appropriate: allergies, current medications, past family history, past medical history, past social history, past surgical history and problem list. Problem list updated.  Objective  Blood pressure 102/60, weight 191 lb (86.6 kg). Pregravid weight 177 lb (80.3 kg) Total Weight Gain 14 lb (6.35 kg) Urinalysis: Urine Protein    Urine Glucose    Fetal Status: Fetal Heart Rate (bpm): 149 Fundal Height: 35 cm Movement: Present     General:  Alert, oriented and cooperative. Patient is in no acute distress.  Skin: Skin is warm and dry. No rash noted.   Cardiovascular: Normal heart rate noted  Respiratory: Normal respiratory effort, no problems with respiration noted  Abdomen: Soft, gravid, appropriate for gestational age. Pain/Pressure: Present     Pelvic:  Cervical exam deferred        Extremities: Normal range of motion.  Edema: None  Mental Status: Normal mood and affect. Normal behavior. Normal judgment and thought content.   Assessment   23 y.o. [name] at [redacted]w[redacted]d by  08/13/2019, by Ultrasound presenting for routine prenatal visit  Plan   pregnancy Problems (from 01/24/19 to present)     Problem Noted Resolved   Supervision of other normal pregnancy, antepartum 01/24/2019 by Rod Can, CNM No   Overview Addendum 06/06/2019 10:23 AM by Gae Dry, MD    [facility]inic [facility] Prenatal [facility]  Dating Ultrasound at [redacted]w[redacted]d Blood type: O/Positive/-- (05/22 0928)   Genetic Screen Declines Antibody:Negative (05/22 0928)  Anatomic [place] Complete 7/17 Rubella: 2.05 (05/22 0928) Varicella: NONIMMUNE  GTT   Third trimester: 116 RPR: Non Reactive (05/22 0928)   Rhogam  not needed HBsAg: Negative (05/22 0928)   TDaP vaccine  9/25       Flu Shot:declines HIV: Non Reactive (05/22 0928)   Baby Food Plans to initiate breastfeeding                               GBS:   Contraception  Pap: 08/10/2017, NILM  CBB  No   CS/VBAC NA   Support Person [name]              Preterm labor symptoms and general obstetric precautions including but not limited to vaginal bleeding, contractions, leaking of fluid and fetal movement were reviewed in detail with the patient. Please refer to After Visit Summary for other counseling recommendations.  GBS/aptima next visit  Return in about 2 weeks (around 07/16/2019) for rob.  Rod Can, CNM 07/02/2019 11:09 AM

## 2019-07-16 ENCOUNTER — Encounter: Payer: BC Managed Care – PPO | Admitting: Advanced Practice Midwife

## 2019-07-16 ENCOUNTER — Encounter: Payer: Self-pay | Admitting: Obstetrics and Gynecology

## 2019-07-16 ENCOUNTER — Other Ambulatory Visit: Payer: Self-pay

## 2019-07-16 ENCOUNTER — Ambulatory Visit (INDEPENDENT_AMBULATORY_CARE_PROVIDER_SITE_OTHER): Payer: BC Managed Care – PPO | Admitting: Obstetrics and Gynecology

## 2019-07-16 ENCOUNTER — Telehealth: Payer: Self-pay

## 2019-07-16 VITALS — BP 138/70 | Wt 195.0 lb

## 2019-07-16 DIAGNOSIS — Z3A36 36 weeks gestation of pregnancy: Secondary | ICD-10-CM

## 2019-07-16 DIAGNOSIS — Z3480 Encounter for supervision of other normal pregnancy, unspecified trimester: Secondary | ICD-10-CM

## 2019-07-16 DIAGNOSIS — Z3483 Encounter for supervision of other normal pregnancy, third trimester: Secondary | ICD-10-CM

## 2019-07-16 LAB — POCT URINALYSIS DIPSTICK OB: Glucose, UA: NEGATIVE

## 2019-07-16 NOTE — Telephone Encounter (Signed)
Glenda calling from [place] regarding short term disability. She requests a call back. [phone]

## 2019-07-16 NOTE — Progress Notes (Signed)
ROB No concerns GBS/Aptima  Denies lof, no vb Good fM

## 2019-07-16 NOTE — Progress Notes (Signed)
    Routine Prenatal Care Visit  Subjective  Mary Schwartz is a 23 y.o. [name] at [redacted]w[redacted]d being seen today for ongoing prenatal care.  She is currently monitored for the following issues for this low-risk pregnancy and has Supervision of other normal pregnancy, antepartum on their problem list.  ----------------------------------------------------------------------------------- Patient reports no complaints.   Contractions: Not present. Vag. Bleeding: None.  Movement: Present. Denies leaking of fluid.  ----------------------------------------------------------------------------------- The following portions of the patient's history were reviewed and updated as appropriate: allergies, current medications, past family history, past medical history, past social history, past surgical history and problem list. Problem list updated.   Objective  Blood pressure 138/70, weight 195 lb (88.5 kg). Pregravid weight 177 lb (80.3 kg) Total Weight Gain 18 lb (8.165 kg) Urinalysis:      Fetal Status: Fetal Heart Rate (bpm): 140 Fundal Height: 37 cm Movement: Present  Presentation: Vertex  General:  Alert, oriented and cooperative. Patient is in no acute distress.  Skin: Skin is warm and dry. No rash noted.   Cardiovascular: Normal heart rate noted  Respiratory: Normal respiratory effort, no problems with respiration noted  Abdomen: Soft, gravid, appropriate for gestational age. Pain/Pressure: Absent     Pelvic:  Cervical exam performed Dilation: 3 Effacement (%): 80 Station: -3  Extremities: Normal range of motion.  Edema: None  Mental Status: Normal mood and affect. Normal behavior. Normal judgment and thought content.     Assessment   23 y.o. [name] at [redacted]w[redacted]d by  08/13/2019, by Ultrasound presenting for routine prenatal visit  Plan   pregnancy Problems (from 01/24/19 to present)    Problem Noted Resolved   Supervision of other normal pregnancy, antepartum 01/24/2019 by Rod Can, CNM No    Overview Addendum 06/06/2019 10:23 AM by Gae Dry, MD    [facility]inic [facility] Prenatal [facility]  Dating Ultrasound at [redacted]w[redacted]d Blood type: O/Positive/-- (05/22 0928)   Genetic Screen Declines Antibody:Negative (05/22 0928)  Anatomic [place] Complete 7/17 Rubella: 2.05 (05/22 0928) Varicella: NONIMMUNE  GTT   Third trimester: 116 RPR: Non Reactive (05/22 0928)   Rhogam  not needed HBsAg: Negative (05/22 0928)   TDaP vaccine  9/25       Flu Shot:declines HIV: Non Reactive (05/22 0928)   Baby Food Plans to initiate breastfeeding                               GBS:   Contraception  Pap: 08/10/2017, NILM  CBB  No   CS/VBAC NA   Support Person [name]              Gestational age appropriate obstetric precautions including but not limited to vaginal bleeding, contractions, leaking of fluid and fetal movement were reviewed in detail with the patient.    GBS/ GC/ CT today  Return in about 1 week (around 07/23/2019) for ROB.  Homero Fellers MD [facility] 07/16/2019, 5:12 PM

## 2019-07-17 DIAGNOSIS — Z3A36 36 weeks gestation of pregnancy: Secondary | ICD-10-CM | POA: Diagnosis not present

## 2019-07-17 DIAGNOSIS — Z3480 Encounter for supervision of other normal pregnancy, unspecified trimester: Secondary | ICD-10-CM | POA: Diagnosis not present

## 2019-07-18 NOTE — Telephone Encounter (Signed)
Mary Schwartz rep is calling to "confirm reason for disability for pregnancy claim". Claim #[id number] and phone # left [phone]

## 2019-07-21 LAB — CULTURE, BETA STREP (GROUP B ONLY): Strep Gp B Culture: NEGATIVE

## 2019-07-21 NOTE — Progress Notes (Signed)
WNL

## 2019-07-22 LAB — NUSWAB VAGINITIS PLUS (VG+)
Atopobium vaginae: HIGH Score — AB
Candida albicans, NAA: NEGATIVE
Candida glabrata, NAA: NEGATIVE
Chlamydia trachomatis, NAA: NEGATIVE
Neisseria gonorrhoeae, NAA: NEGATIVE
Trich vag by NAA: POSITIVE — AB

## 2019-07-23 ENCOUNTER — Encounter: Payer: Self-pay | Admitting: Advanced Practice Midwife

## 2019-07-23 ENCOUNTER — Observation Stay
Admission: EM | Admit: 2019-07-23 | Discharge: 2019-07-23 | Disposition: A | Discharge status: Home / Self Care | Payer: BC Managed Care – PPO | Attending: Obstetrics and Gynecology | Admitting: Obstetrics and Gynecology

## 2019-07-23 ENCOUNTER — Ambulatory Visit (INDEPENDENT_AMBULATORY_CARE_PROVIDER_SITE_OTHER): Payer: BC Managed Care – PPO | Admitting: Advanced Practice Midwife

## 2019-07-23 ENCOUNTER — Other Ambulatory Visit: Payer: Self-pay

## 2019-07-23 VITALS — BP 136/84 | Wt 200.0 lb

## 2019-07-23 DIAGNOSIS — M549 Dorsalgia, unspecified: Secondary | ICD-10-CM | POA: Diagnosis not present

## 2019-07-23 DIAGNOSIS — O98819 Other maternal infectious and parasitic diseases complicating pregnancy, unspecified trimester: Secondary | ICD-10-CM | POA: Diagnosis not present

## 2019-07-23 DIAGNOSIS — A599 Trichomoniasis, unspecified: Secondary | ICD-10-CM | POA: Diagnosis not present

## 2019-07-23 DIAGNOSIS — Z3A37 37 weeks gestation of pregnancy: Secondary | ICD-10-CM

## 2019-07-23 DIAGNOSIS — Z3480 Encounter for supervision of other normal pregnancy, unspecified trimester: Secondary | ICD-10-CM

## 2019-07-23 DIAGNOSIS — Z3483 Encounter for supervision of other normal pregnancy, third trimester: Secondary | ICD-10-CM

## 2019-07-23 LAB — COMPREHENSIVE METABOLIC PANEL
ALT: 9 U/L (ref 0–44)
AST: 12 U/L — ABNORMAL LOW (ref 15–41)
Albumin: 2.9 g/dL — ABNORMAL LOW (ref 3.5–5.0)
Alkaline Phosphatase: 105 U/L (ref 38–126)
Anion gap: 8 (ref 5–15)
BUN: 11 mg/dL (ref 6–20)
CO2: 23 mmol/L (ref 22–32)
Calcium: 8.5 mg/dL — ABNORMAL LOW (ref 8.9–10.3)
Chloride: 105 mmol/L (ref 98–111)
Creatinine, Ser: 0.73 mg/dL (ref 0.44–1.00)
GFR calc Af Amer: >60 mL/min (ref >60)
GFR calc non Af Amer: >60 mL/min (ref >60)
Glucose, Bld: 101 mg/dL — ABNORMAL HIGH (ref 70–99)
Potassium: 3.8 mmol/L (ref 3.5–5.1)
Sodium: 136 mmol/L (ref 135–145)
Total Bilirubin: 0.3 mg/dL (ref 0.3–1.2)
Total Protein: 6.8 g/dL (ref 6.5–8.1)

## 2019-07-23 LAB — CBC
HCT: 29.8 % — ABNORMAL LOW (ref 36.0–46.0)
Hemoglobin: 9.3 g/dL — ABNORMAL LOW (ref 12.0–15.0)
MCH: 25.3 pg — ABNORMAL LOW (ref 26.0–34.0)
MCHC: 31.2 g/dL (ref 30.0–36.0)
MCV: 81 fL (ref 80.0–100.0)
Platelets: 242 10*3/uL (ref 150–400)
RBC: 3.68 MIL/uL — ABNORMAL LOW (ref 3.87–5.11)
RDW: 15.4 % (ref 11.5–15.5)
WBC: 10.7 10*3/uL — ABNORMAL HIGH (ref 4.0–10.5)
nRBC: 0 % (ref 0.0–0.2)

## 2019-07-23 LAB — FERRITIN: Ferritin: 10 ng/mL — ABNORMAL LOW (ref 11–307)

## 2019-07-23 LAB — IRON AND TIBC
Iron: 42 ug/dL (ref 28–170)
Saturation Ratios: 8 % — ABNORMAL LOW (ref 10.4–31.8)
TIBC: 531 ug/dL — ABNORMAL HIGH (ref 250–450)
UIBC: 489 ug/dL

## 2019-07-23 LAB — PROTEIN / CREATININE RATIO, URINE
Creatinine, Urine: 386 mg/dL
Protein Creatinine Ratio: 0.25 mg/mg{Cre} — ABNORMAL HIGH (ref 0.00–0.15)
Total Protein, Urine: 97 mg/dL

## 2019-07-23 LAB — VITAMIN B12: Vitamin B-12: 228 pg/mL (ref 180–914)

## 2019-07-23 MED ORDER — METRONIDAZOLE 500 MG PO TABS: 2000.0000 mg | ORAL_TABLET | Freq: Once | ORAL | 1 refills | Status: AC

## 2019-07-23 MED ORDER — ONDANSETRON HCL 4 MG PO TABS: 4.0000 mg | ORAL_TABLET | Freq: Once | ORAL | 1 refills | Status: AC

## 2019-07-23 NOTE — Discharge Summary (Signed)
Physician Discharge Summary   Patient ID: Mary Schwartz [id number] 23 y.o. 27-Nov-1995  Admit date: 07/23/2019  Discharge date and time: No discharge date for patient encounter.   Admitting Physician: Natale Milch, MD   Discharge Physician: Adelene Idler MD  Admission Diagnoses: Abd and Back Pain  Discharge Diagnoses: Trichomoniasis,   Admission Condition: good  Discharged Condition: good  Indication for Admission: Evaluation of RUQ pain  Hospital Course: patient was admitted for complaints of RUQ pain. Labs were normal, pain resolved on its own. Discussed her recent positive Trichomoniasis result and treatment. Discussed anemia and treatment with OTC iron, okay for referral to hematology if desired  Consults: None  Significant Diagnostic Studies: labs: see EPIC  Treatments:  None  Discharge Exam: BP 132/84 (BP Location: Left Arm)   Pulse (!) 105   Temp 99.1 F (37.3 C) (Oral)   Resp 16   Ht 5\' 2"  (1.575 m)   Wt 86.6 kg   LMP  (LMP Unknown)   BMI 34.93 kg/m   General Appearance:    Alert, cooperative, no distress, appears stated age  Head:    Normocephalic, without obvious abnormality, atraumatic  Eyes:    PERRL, conjunctiva/corneas clear, EOM's intact, fundi    benign, both eyes  Ears:    Normal TM's and external ear canals, both ears  Nose:   Nares normal, septum midline, mucosa normal, no drainage    or sinus tenderness  Throat:   Lips, mucosa, and tongue normal; teeth and gums normal  Neck:   Supple, symmetrical, trachea midline, no adenopathy;    thyroid:  no enlargement/tenderness/nodules; no carotid   bruit or JVD  Back:     Symmetric, no curvature, ROM normal, no CVA tenderness  Lungs:     Clear to auscultation bilaterally, respirations unlabored  Chest Wall:    No tenderness or deformity   Heart:    Regular rate and rhythm, S1 and S2 normal, no murmur, rub   or gallop  Breast Exam:    No tenderness, masses, or nipple abnormality   Abdomen:     Soft, non-tender, bowel sounds active all four quadrants,    no masses, no organomegaly  Genitalia:    Normal female without lesion, discharge or tenderness  Rectal:    Normal tone, normal prostate, no masses or tenderness;   guaiac negative stool  Extremities:   Extremities normal, atraumatic, no cyanosis or edema  Pulses:   2+ and symmetric all extremities  Skin:   Skin color, texture, turgor normal, no rashes or lesions  Lymph nodes:   Cervical, supraclavicular, and axillary nodes normal  Neurologic:   CNII-XII intact, normal strength, sensation and reflexes    throughout    Disposition: Discharge disposition: 01-Home or Self Care       Patient Instructions:  Allergies as of 07/23/2019   No Known Allergies     Medication List    TAKE these medications   cyclobenzaprine 5 MG tablet Commonly known as: FLEXERIL Take 2.5 mg by mouth daily as needed for muscle spasms.   Lidocaine 4 % Ptch Apply 1 patch topically daily.   metroNIDAZOLE 500 MG tablet Commonly known as: FLAGYL Take 4 tablets (2,000 mg total) by mouth once for 1 dose.   ondansetron 4 MG tablet Commonly known as: Zofran Take 1 tablet (4 mg total) by mouth once for 1 dose.   PRENATAL VITAMIN PO Take by mouth.      Activity: activity as tolerated Diet: regular  diet Wound Care: none needed  Follow-up with [facility] in 1 day.  Signed: Homero Fellers 07/23/2019 4:28 AM

## 2019-07-23 NOTE — Discharge Instructions (Signed)
Iron-Rich Diet  Iron is a mineral that helps your body to produce hemoglobin. Hemoglobin is a protein in red blood cells that carries oxygen to your body's tissues. Eating too little iron may cause you to feel weak and tired, and it can increase your risk of infection. Iron is naturally found in many foods, and many foods have iron added to them (iron-fortified foods). You may need to follow an iron-rich diet if you do not have enough iron in your body due to certain medical conditions. The amount of iron that you need each day depends on your age, your sex, and any medical conditions you have. Follow instructions from your health care provider or a diet and nutrition specialist (dietitian) about how much iron you should eat each day. What are tips for following this plan? Reading food labels  Check food labels to see how many milligrams (mg) of iron are in each serving. Cooking  Cook foods in pots and pans that are made from iron.  Take these steps to make it easier for your body to absorb iron from certain foods: ? Soak beans overnight before cooking. ? Soak whole grains overnight and drain them before using. ? Ferment flours before baking, such as by using yeast in bread dough. Meal planning  When you eat foods that contain iron, you should eat them with foods that are high in vitamin C. These include oranges, peppers, tomatoes, potatoes, and mango. Vitamin C helps your body to absorb iron. General information  Take iron supplements only as told by your health care provider. An overdose of iron can be life-threatening. If you were prescribed iron supplements, take them with orange juice or a vitamin C supplement.  When you eat iron-fortified foods or take an iron supplement, you should also eat foods that naturally contain iron, such as meat, poultry, and fish. Eating naturally iron-rich foods helps your body to absorb the iron that is added to other foods or contained in a  supplement.  Certain foods and drinks prevent your body from absorbing iron properly. Avoid eating these foods in the same meal as iron-rich foods or with iron supplements. These foods include: ? Coffee, black tea, and red wine. ? Milk, dairy products, and foods that are high in calcium. ? Beans and soybeans. ? Whole grains. What foods should I eat? Fruits Prunes. Raisins. Eat fruits high in vitamin C, such as oranges, grapefruits, and strawberries, alongside iron-rich foods. Vegetables Spinach (cooked). Green peas. Broccoli. Fermented vegetables. Eat vegetables high in vitamin C, such as leafy greens, potatoes, bell peppers, and tomatoes, alongside iron-rich foods. Grains Iron-fortified breakfast cereal. Iron-fortified whole-wheat bread. Enriched rice. Sprouted grains. Meats and other proteins Beef liver. Oysters. Beef. Shrimp. [place]. Chicken. [place]. Sardines. Chickpeas. Nuts. Tofu. Pumpkin seeds. Beverages Tomato juice. Fresh orange juice. Prune juice. Hibiscus tea. Fortified instant breakfast shakes. Sweets and desserts Blackstrap molasses. Seasonings and condiments Tahini. Fermented soy sauce. Other foods Wheat germ. The items listed above may not be a complete list of recommended foods and beverages. Contact a dietitian for more information. What foods should I avoid? Grains Whole grains. Bran cereal. Bran flour. Oats. Meats and other proteins Soybeans. Products made from soy protein. Black beans. Lentils. Mung beans. Split peas. Dairy Milk. Cream. Cheese. Yogurt. Cottage cheese. Beverages Coffee. Black tea. Red wine. Sweets and desserts Cocoa. Chocolate. Ice cream. Other foods Basil. Oregano. Large amounts of parsley. The items listed above may not be a complete list of foods and beverages to avoid.  Contact a dietitian for more information. Summary  Iron is a mineral that helps your body to produce hemoglobin. Hemoglobin is a protein in red blood cells that carries  oxygen to your body's tissues.  Iron is naturally found in many foods, and many foods have iron added to them (iron-fortified foods).  When you eat foods that contain iron, you should eat them with foods that are high in vitamin C. Vitamin C helps your body to absorb iron.  Certain foods and drinks prevent your body from absorbing iron properly, such as whole grains and dairy products. You should avoid eating these foods in the same meal as iron-rich foods or with iron supplements. This information is not intended to replace advice given to you by your health care provider. Make sure you discuss any questions you have with your health care provider. Document Released: 04/11/2005 Document Revised: 08/10/2017 Document Reviewed: 07/24/2017 Elsevier Patient Education  2020 [employer]. Trichomoniasis Trichomoniasis is an STI (sexually transmitted infection) that can affect both women and men. In women, the outer area of the female genitalia (vulva) and the vagina are affected. In men, mainly the penis is affected, but the prostate and other reproductive organs can also be involved.  This condition can be treated with medicine. It often has no symptoms (is asymptomatic), especially in men. If not treated, trichomoniasis can last for months or years. What are the causes? This condition is caused by a parasite called Trichomonas vaginalis. Trichomoniasis most often spreads from person to person (is contagious) through sexual contact. What increases the risk? The following factors may make you more likely to develop this condition:  Having unprotected sex.  Having sex with a partner who has trichomoniasis.  Having multiple sexual partners.  Having had previous trichomoniasis infections or other STIs. What are the signs or symptoms? In women, symptoms of trichomoniasis include:  Abnormal vaginal discharge that is clear, white, gray, or yellow-green and foamy and has an unusual "fishy"  odor.  Itching and irritation of the vagina and vulva.  Burning or pain during urination or sex.  Redness and swelling of the genitals. In men, symptoms of trichomoniasis include:  Penile discharge that may be foamy or contain pus.  Pain in the penis. This may happen only when urinating.  Itching or irritation inside the penis.  Burning after urination or ejaculation. How is this diagnosed? In women, this condition may be found during a routine Pap test or physical exam. It may be found in men during a routine physical exam. Your health care provider may do tests to help diagnose this infection, such as:  Urine tests (men and women).  The following in women: ? Testing the pH of the vagina. ? A vaginal swab test that checks for the Trichomonas vaginalis parasite. ? Testing vaginal secretions. Your health care provider may test you for other STIs, including HIV (human immunodeficiency virus). How is this treated? This condition is treated with medicine taken by mouth (orally), such as metronidazole or tinidazole, to fight the infection. Your sexual partner(s) also need to be tested and treated.  If you are a woman and you plan to become pregnant or think you may be pregnant, tell your health care provider right away. Some medicines that are used to treat the infection should not be taken during pregnancy. Your health care provider may recommend over-the-counter medicines or creams to help relieve itching or irritation. You may be tested for infection again 3 months after treatment. Follow these instructions  at home:  Take and use over-the-counter and prescription medicines, including creams, only as told by your health care provider.  Take your antibiotic medicine as told by your health care provider. Do not stop taking the antibiotic even if you start to feel better.  Do not have sex until 7-10 days after you finish your medicine, or until your health care provider approves. Ask  your health care provider when you may start to have sex again.  (Women) Do not douche or wear tampons while you have the infection.  Discuss your infection with your sexual partner(s). Make sure that your partner gets tested and treated, if necessary.  Keep all follow-up visits as told by your health care provider. This is important. How is this prevented?   Use condoms every time you have sex. Using condoms correctly and consistently can help protect against STIs.  Avoid having multiple sexual partners.  Talk with your sexual partner about any symptoms that either of you may have, as well as any history of STIs.  Get tested for STIs and STDs (sexually transmitted diseases) before you have sex. Ask your partner to do the same.  Do not have sexual contact if you have symptoms of trichomoniasis or another STI. Contact a health care provider if:  You still have symptoms after you finish your medicine.  You develop pain in your abdomen.  You have pain when you urinate.  You have bleeding after sex.  You develop a rash.  You feel nauseous or you vomit.  You plan to become pregnant or think you may be pregnant. Summary  Trichomoniasis is an STI (sexually transmitted infection) that can affect both women and men.  This condition often has no symptoms (is asymptomatic), especially in men.  Without treatment, this condition can last for months or years.  You should not have sex until 7-10 days after you finish your medicine, or until your health care provider approves. Ask your health care provider when you may start to have sex again.  Discuss your infection with your sexual partner(s). Make sure that your partner gets tested and treated, if necessary. This information is not intended to replace advice given to you by your health care provider. Make sure you discuss any questions you have with your health care provider. Document Released: 02/21/2001 Document Revised: 06/11/2018  Document Reviewed: 06/11/2018 Elsevier Patient Education  2020 Elsevier Inc[employer]  Preeclampsia and Eclampsia Preeclampsia is a serious condition that may develop during pregnancy. This condition causes high blood pressure and increased protein in your urine along with other symptoms, such as headaches and vision changes. These symptoms may develop as the condition gets worse. Preeclampsia may occur at 20 weeks of pregnancy or later. Diagnosing and treating preeclampsia early is very important. If not treated early, it can cause serious problems for you and your baby. One problem it can lead to is eclampsia. Eclampsia is a condition that causes muscle jerking or shaking (convulsions or seizures) and other serious problems for the mother. During pregnancy, delivering your baby may be the best treatment for preeclampsia or eclampsia. For most women, preeclampsia and eclampsia symptoms go away after giving birth. In rare cases, a woman may develop preeclampsia after giving birth (postpartum preeclampsia). This usually occurs within 48 hours after childbirth but may occur up to 6 weeks after giving birth. What are the causes? The cause of preeclampsia is not known. What increases the risk? The following risk factors make you more likely to develop preeclampsia:  Being pregnant for the first time.  Having had preeclampsia during a past pregnancy.  Having a family history of preeclampsia.  Having high blood pressure.  Being pregnant with more than one baby.  Being 28 or older.  Being African-American.  Having kidney disease or diabetes.  Having medical conditions such as lupus or blood diseases.  Being very overweight (obese). What are the signs or symptoms? The most common symptoms are:  Severe headaches.  Vision problems, such as blurred or double vision.  Abdominal pain, especially upper abdominal pain. Other symptoms that may develop as the condition gets worse include:  Sudden  weight gain.  Sudden swelling of the hands, face, legs, and feet.  Severe nausea and vomiting.  Numbness in the face, arms, legs, and feet.  Dizziness.  Urinating less than usual.  Slurred speech.  Convulsions or seizures. How is this diagnosed? There are no screening tests for preeclampsia. Your health care provider will ask you about symptoms and check for signs of preeclampsia during your prenatal visits. You may also have tests that include:  Checking your blood pressure.  Urine tests to check for protein. Your health care provider will check for this at every prenatal visit.  Blood tests.  Monitoring your baby's heart rate.  Ultrasound. How is this treated? You and your health care provider will determine the treatment approach that is best for you. Treatment may include:  Having more frequent prenatal exams to check for signs of preeclampsia, if you have an increased risk for preeclampsia.  Medicine to lower your blood pressure.  Staying in the hospital, if your condition is severe. There, treatment will focus on controlling your blood pressure and the amount of fluids in your body (fluid retention).  Taking medicine (magnesium sulfate) to prevent seizures. This may be given as an injection or through an IV.  Taking a low-dose aspirin during your pregnancy.  Delivering your baby early. You may have your labor started with medicine (induced), or you may have a cesarean delivery. Follow these instructions at home: Eating and drinking   Drink enough fluid to keep your urine pale yellow.  Avoid caffeine. Lifestyle  Do not use any products that contain nicotine or tobacco, such as cigarettes and e-cigarettes. If you need help quitting, ask your health care provider.  Do not use alcohol or drugs.  Avoid stress as much as possible. Rest and get plenty of sleep. General instructions  Take over-the-counter and prescription medicines only as told by your health  care provider.  When lying down, lie on your left side. This keeps pressure off your major blood vessels.  When sitting or lying down, raise (elevate) your feet. Try putting some pillows underneath your lower legs.  Exercise regularly. Ask your health care provider what kinds of exercise are best for you.  Keep all follow-up and prenatal visits as told by your health care provider. This is important. How is this prevented? There is no known way of preventing preeclampsia or eclampsia from developing. However, to lower your risk of complications and detect problems early:  Get regular prenatal care. Your health care provider may be able to diagnose and treat the condition early.  Maintain a healthy weight. Ask your health care provider for help managing weight gain during pregnancy.  Work with your health care provider to manage any long-term (chronic) health conditions you have, such as diabetes or kidney problems.  You may have tests of your blood pressure and kidney function after giving birth.  Your health care provider may have you take low-dose aspirin during your next pregnancy. Contact a health care provider if:  You have symptoms that your health care provider told you may require more treatment or monitoring, such as: ? Headaches. ? Nausea or vomiting. ? Abdominal pain. ? Dizziness. ? Light-headedness. Get help right away if:  You have severe: ? Abdominal pain. ? Headaches that do not get better. ? Dizziness. ? Vision problems. ? Confusion. ? Nausea or vomiting.  You have any of the following: ? A seizure. ? Sudden, rapid weight gain. ? Sudden swelling in your hands, ankles, or face. ? Trouble moving any part of your body. ? Numbness in any part of your body. ? Trouble speaking. ? Abnormal bleeding.  You faint. Summary  Preeclampsia is a serious condition that may develop during pregnancy.  This condition causes high blood pressure and increased protein  in your urine along with other symptoms, such as headaches and vision changes.  Diagnosing and treating preeclampsia early is very important. If not treated early, it can cause serious problems for you and your baby.  Get help right away if you have symptoms that your health care provider told you to watch for. This information is not intended to replace advice given to you by your health care provider. Make sure you discuss any questions you have with your health care provider. Document Released: 08/25/2000 Document Revised: 04/30/2018 Document Reviewed: 04/03/2016 Elsevier Patient Education  2020 [employer]

## 2019-07-23 NOTE — OB Triage Note (Signed)
Pt arrived to unit with c/o upper right quadrant pain 6/10. Pt reports that it feel like a "pulled muscle" and "like baby is in my rib". Denies tenderness at the sight. No tenderness noted on palpation. Pt also reports continued lower back pain and N/V today after a shower around 0000 and emesis x1 today around 0100 on 07/23/19. +FM. Denies LOF or bleeding. monitors applied. Continue to assess.

## 2019-07-23 NOTE — Progress Notes (Signed)
  Routine Prenatal Care Visit  Subjective  Mary Schwartz is a 23 y.o. [name] at [redacted]w[redacted]d being seen today for ongoing prenatal care.  She is currently monitored for the following issues for this low-risk pregnancy and has Supervision of other normal pregnancy, antepartum and Labor and delivery, indication for care on their problem list.  ----------------------------------------------------------------------------------- Patient reports no complaints.  The RUQ pain she had yesterday has resolved. She plans to start taking PO Fe and increase Fe rich foods.  Contractions: Not present. Vag. Bleeding: None.  Movement: Present. Leaking Fluid denies.  ----------------------------------------------------------------------------------- The following portions of the patient's history were reviewed and updated as appropriate: allergies, current medications, past family history, past medical history, past social history, past surgical history and problem list. Problem list updated.  Objective  Blood pressure 136/84, weight 200 lb (90.7 kg). Pregravid weight 177 lb (80.3 kg) Total Weight Gain 23 lb (10.4 kg) Urinalysis: Urine Protein    Urine Glucose    Fetal Status: Fetal Heart Rate (bpm): 139 Fundal Height: 38 cm Movement: Present     General:  Alert, oriented and cooperative. Patient is in no acute distress.  Skin: Skin is warm and dry. No rash noted.   Cardiovascular: Normal heart rate noted  Respiratory: Normal respiratory effort, no problems with respiration noted  Abdomen: Soft, gravid, appropriate for gestational age. Pain/Pressure: Present     Pelvic:  Cervical exam deferred        Extremities: Normal range of motion.     Mental Status: Normal mood and affect. Normal behavior. Normal judgment and thought content.   Assessment   23 y.o. [name] at [redacted]w[redacted]d by  08/13/2019, by Ultrasound presenting for routine prenatal visit  Plan   pregnancy Problems (from 01/24/19 to present)    Problem  Noted Resolved   Supervision of other normal pregnancy, antepartum 01/24/2019 by Rod Can, CNM No   Overview Addendum 06/06/2019 10:23 AM by Gae Dry, MD    [facility] Prenatal Labs  Dating Ultrasound at [redacted]w[redacted]d Blood type: O/Positive/-- (05/22 0928)   Genetic Screen Declines Antibody:Negative (05/22 0928)  Anatomic [place] Complete 7/17 Rubella: 2.05 (05/22 0928) Varicella: NONIMMUNE  GTT   Third trimester: 116 RPR: Non Reactive (05/22 0928)   Rhogam  not needed HBsAg: Negative (05/22 0928)   TDaP vaccine  9/25       Flu Shot:declines HIV: Non Reactive (05/22 0928)   Baby Food Plans to initiate breastfeeding                               GBS:   Contraception Mini-pill Pap: 08/10/2017, NILM  CBB  No   CS/VBAC NA   Support Person [name]              Term labor symptoms and general obstetric precautions including but not limited to vaginal bleeding, contractions, leaking of fluid and fetal movement were reviewed in detail with the patient.    Return in about 1 week (around 07/30/2019) for rob.  Rod Can, CNM 07/23/2019 9:05 AM

## 2019-07-23 NOTE — Progress Notes (Signed)
No vb. No lof. Went to hospital and was just d/c this AM.

## 2019-07-25 NOTE — Progress Notes (Signed)
Discussed with patient- rx sent and she is feelign better

## 2019-07-28 ENCOUNTER — Telehealth: Payer: Self-pay

## 2019-07-28 NOTE — Telephone Encounter (Signed)
FMLA/DISABILITY form for [employer] filled out, signature obtained, and given to KT for processing.

## 2019-07-30 ENCOUNTER — Ambulatory Visit (INDEPENDENT_AMBULATORY_CARE_PROVIDER_SITE_OTHER): Payer: BC Managed Care – PPO | Admitting: Maternal Newborn

## 2019-07-30 ENCOUNTER — Other Ambulatory Visit (HOSPITAL_COMMUNITY)
Admission: RE | Admit: 2019-07-30 | Discharge: 2019-07-30 | Disposition: A | Discharge status: Home / Self Care | Payer: BC Managed Care – PPO | Source: Ambulatory Visit | Attending: Maternal Newborn | Admitting: Maternal Newborn

## 2019-07-30 ENCOUNTER — Other Ambulatory Visit: Payer: Self-pay

## 2019-07-30 ENCOUNTER — Encounter: Payer: Self-pay | Admitting: Maternal Newborn

## 2019-07-30 VITALS — BP 120/80 | Wt 203.0 lb

## 2019-07-30 DIAGNOSIS — Z3403 Encounter for supervision of normal first pregnancy, third trimester: Secondary | ICD-10-CM | POA: Insufficient documentation

## 2019-07-30 DIAGNOSIS — Z3483 Encounter for supervision of other normal pregnancy, third trimester: Secondary | ICD-10-CM

## 2019-07-30 DIAGNOSIS — Z3A38 38 weeks gestation of pregnancy: Secondary | ICD-10-CM

## 2019-07-30 DIAGNOSIS — Z3480 Encounter for supervision of other normal pregnancy, unspecified trimester: Secondary | ICD-10-CM

## 2019-07-30 LAB — POCT URINALYSIS DIPSTICK OB: Glucose, UA: NEGATIVE

## 2019-07-30 NOTE — Progress Notes (Signed)
    Routine Prenatal Care Visit  Subjective  Mary Schwartz is a 23 y.o. [name] at [redacted]w[redacted]d being seen today for ongoing prenatal care.  She is currently monitored for the following issues for this low-risk pregnancy and has Supervision of other normal pregnancy, antepartum and Labor and delivery, indication for care on their problem list.  ----------------------------------------------------------------------------------- Patient reports no complaints.   Contractions: Not present. Vag. Bleeding: None.  Movement: Present. No leaking of fluid.  ----------------------------------------------------------------------------------- The following portions of the patient's history were reviewed and updated as appropriate: allergies, current medications, past family history, past medical history, past social history, past surgical history and problem list. Problem list updated.   Objective  Blood pressure 120/80, weight 203 lb (92.1 kg). Pregravid weight 177 lb (80.3 kg) Total Weight Gain 26 lb (11.8 kg) Urinalysis: Urine dipstick shows negative for glucose, positive for protein (trace).  Fetal Status: Fetal Heart Rate (bpm): 145   Movement: Present     General:  Alert, oriented and cooperative. Patient is in no acute distress.  Skin: Skin is warm and dry. No rash noted.   Cardiovascular: Normal heart rate noted  Respiratory: Normal respiratory effort, no problems with respiration noted  Abdomen: Soft, gravid, appropriate for gestational age. Pain/Pressure: Absent     Pelvic:  Cervical exam performed Dilation: Closed Effacement (%): 60 Station: -2  Extremities: Normal range of motion.  Edema: None  Mental Status: Normal mood and affect. Normal behavior. Normal judgment and thought content.     Assessment   23 y.o. [name] at [redacted]w[redacted]d EDD 08/13/2019, by Ultrasound presenting for a routine prenatal visit.  Plan   pregnancy Problems (from 01/24/19 to present)    Problem Noted Resolved   Supervision of other normal pregnancy, antepartum 01/24/2019 by Rod Can, CNM No   Overview Addendum 07/23/2019  9:07 AM by Rod Can, [facility] Prenatal Labs  Dating Ultrasound at [redacted]w[redacted]d Blood type: O/Positive/-- (05/22 0928)   Genetic Screen Declines Antibody:Negative (05/22 0928)  Anatomic [place] Complete 7/17 Rubella: 2.05 (05/22 0928) Varicella: NONIMMUNE  GTT   Third trimester: 116 RPR: Non Reactive (05/22 0928)   Rhogam  not needed HBsAg: Negative (05/22 0928)   TDaP vaccine  9/25       Flu Shot:declines HIV: Non Reactive (05/22 0928)   Baby Food Plans to initiate breastfeeding                               GBS:   Contraception Mini-pill Pap: 08/10/2017, NILM  CBB  No   CS/VBAC NA   Support Person [name]             GBS and Aptima today.  Please refer to After Visit Summary for other counseling recommendations.   Return in about 1 week (around 08/06/2019) for ROB.  Avel Sensor, CNM 07/30/2019  4:13 PM

## 2019-07-30 NOTE — Patient Instructions (Signed)

## 2019-07-30 NOTE — Progress Notes (Signed)
No concerns.rj 

## 2019-07-31 DIAGNOSIS — Z3403 Encounter for supervision of normal first pregnancy, third trimester: Secondary | ICD-10-CM | POA: Diagnosis not present

## 2019-08-01 LAB — CERVICOVAGINAL ANCILLARY ONLY
Chlamydia: NEGATIVE
Comment: NEGATIVE
Comment: NORMAL
Neisseria Gonorrhea: NEGATIVE

## 2019-08-02 LAB — STREP GP B NAA: Strep Gp B NAA: NEGATIVE

## 2019-08-03 ENCOUNTER — Inpatient Hospital Stay: Payer: BC Managed Care – PPO | Admitting: Anesthesiology

## 2019-08-03 ENCOUNTER — Other Ambulatory Visit: Payer: Self-pay

## 2019-08-03 ENCOUNTER — Encounter: Payer: Self-pay | Admitting: *Deleted

## 2019-08-03 ENCOUNTER — Inpatient Hospital Stay
Admission: EM | Admit: 2019-08-03 | Discharge: 2019-08-08 | DRG: 806 | Disposition: A | Discharge status: Home / Self Care | Payer: BC Managed Care – PPO | Attending: Obstetrics and Gynecology | Admitting: Obstetrics and Gynecology

## 2019-08-03 DIAGNOSIS — O134 Gestational [pregnancy-induced] hypertension without significant proteinuria, complicating childbirth: Secondary | ICD-10-CM | POA: Diagnosis not present

## 2019-08-03 DIAGNOSIS — D62 Acute posthemorrhagic anemia: Secondary | ICD-10-CM | POA: Diagnosis not present

## 2019-08-03 DIAGNOSIS — O9081 Anemia of the puerperium: Secondary | ICD-10-CM | POA: Diagnosis not present

## 2019-08-03 DIAGNOSIS — Z20828 Contact with and (suspected) exposure to other viral communicable diseases: Secondary | ICD-10-CM | POA: Diagnosis present

## 2019-08-03 DIAGNOSIS — O1404 Mild to moderate pre-eclampsia, complicating childbirth: Secondary | ICD-10-CM | POA: Diagnosis not present

## 2019-08-03 DIAGNOSIS — Z3A38 38 weeks gestation of pregnancy: Secondary | ICD-10-CM | POA: Diagnosis not present

## 2019-08-03 DIAGNOSIS — Z3480 Encounter for supervision of other normal pregnancy, unspecified trimester: Secondary | ICD-10-CM

## 2019-08-03 DIAGNOSIS — O1400 Mild to moderate pre-eclampsia, unspecified trimester: Secondary | ICD-10-CM | POA: Diagnosis present

## 2019-08-03 LAB — PROTEIN / CREATININE RATIO, URINE
Creatinine, Urine: 159 mg/dL
Protein Creatinine Ratio: 0.3 mg/mg{Cre} — ABNORMAL HIGH (ref 0.00–0.15)
Total Protein, Urine: 47 mg/dL

## 2019-08-03 LAB — CBC
HCT: 28.5 % — ABNORMAL LOW (ref 36.0–46.0)
Hemoglobin: 8.9 g/dL — ABNORMAL LOW (ref 12.0–15.0)
MCH: 25.7 pg — ABNORMAL LOW (ref 26.0–34.0)
MCHC: 31.2 g/dL (ref 30.0–36.0)
MCV: 82.4 fL (ref 80.0–100.0)
Platelets: 265 10*3/uL (ref 150–400)
RBC: 3.46 MIL/uL — ABNORMAL LOW (ref 3.87–5.11)
RDW: 16.3 % — ABNORMAL HIGH (ref 11.5–15.5)
WBC: 9.2 10*3/uL (ref 4.0–10.5)
nRBC: 0 % (ref 0.0–0.2)

## 2019-08-03 LAB — COMPREHENSIVE METABOLIC PANEL
ALT: 14 U/L (ref 0–44)
AST: 18 U/L (ref 15–41)
Albumin: 2.9 g/dL — ABNORMAL LOW (ref 3.5–5.0)
Alkaline Phosphatase: 141 U/L — ABNORMAL HIGH (ref 38–126)
Anion gap: 9 (ref 5–15)
BUN: 7 mg/dL (ref 6–20)
CO2: 20 mmol/L — ABNORMAL LOW (ref 22–32)
Calcium: 8.5 mg/dL — ABNORMAL LOW (ref 8.9–10.3)
Chloride: 108 mmol/L (ref 98–111)
Creatinine, Ser: 0.73 mg/dL (ref 0.44–1.00)
GFR calc Af Amer: >60 mL/min (ref >60)
GFR calc non Af Amer: >60 mL/min (ref >60)
Glucose, Bld: 86 mg/dL (ref 70–99)
Potassium: 3.6 mmol/L (ref 3.5–5.1)
Sodium: 137 mmol/L (ref 135–145)
Total Bilirubin: 0.5 mg/dL (ref 0.3–1.2)
Total Protein: 6.9 g/dL (ref 6.5–8.1)

## 2019-08-03 LAB — URINE DRUG SCREEN, QUALITATIVE (ARMC ONLY)
Amphetamines, Ur Screen: NOT DETECTED
Barbiturates, Ur Screen: NOT DETECTED
Benzodiazepine, Ur Scrn: NOT DETECTED
Cannabinoid 50 Ng, Ur ~~LOC~~: NOT DETECTED
Cocaine Metabolite,Ur ~~LOC~~: NOT DETECTED
MDMA (Ecstasy)Ur Screen: NOT DETECTED
Methadone Scn, Ur: NOT DETECTED
Opiate, Ur Screen: NOT DETECTED
Phencyclidine (PCP) Ur S: NOT DETECTED
Tricyclic, Ur Screen: NOT DETECTED

## 2019-08-03 LAB — CHLAMYDIA/NGC RT PCR (ARMC ONLY)
Chlamydia Tr: NOT DETECTED
N gonorrhoeae: NOT DETECTED

## 2019-08-03 LAB — SARS CORONAVIRUS 2 BY RT PCR (HOSPITAL ORDER, PERFORMED IN ~~LOC~~ HOSPITAL LAB): SARS Coronavirus 2: NEGATIVE

## 2019-08-03 LAB — ABO/RH: ABO/RH(D): O POS

## 2019-08-03 MED ORDER — IBUPROFEN 600 MG PO TABS
600.0000 mg | ORAL_TABLET | Freq: Four times a day (QID) | ORAL | Status: DC
  Administered 2019-08-03 – 2019-08-04 (×4): 600 mg via ORAL
  Filled 2019-08-03 (×5): qty 1

## 2019-08-03 MED ORDER — PHENYLEPHRINE 40 MCG/ML (10ML) SYRINGE FOR IV PUSH (FOR BLOOD PRESSURE SUPPORT)
80.0000 ug | PREFILLED_SYRINGE | INTRAVENOUS | Status: DC | PRN
  Filled 2019-08-03: qty 10

## 2019-08-03 MED ORDER — BUTORPHANOL TARTRATE 1 MG/ML IJ SOLN: 1.0000 mg | INTRAMUSCULAR | Status: DC | PRN

## 2019-08-03 MED ORDER — ONDANSETRON HCL 4 MG PO TABS: 4.0000 mg | ORAL_TABLET | ORAL | Status: DC | PRN

## 2019-08-03 MED ORDER — OXYTOCIN 10 UNIT/ML IJ SOLN
INTRAMUSCULAR | Status: AC
  Filled 2019-08-03: qty 2

## 2019-08-03 MED ORDER — OXYTOCIN 40 UNITS IN NORMAL SALINE INFUSION - SIMPLE MED
1.0000 m[IU]/min | INTRAVENOUS | Status: DC
  Administered 2019-08-03: 13:00:00 2 m[IU]/min via INTRAVENOUS

## 2019-08-03 MED ORDER — SIMETHICONE 80 MG PO CHEW: 80.0000 mg | CHEWABLE_TABLET | ORAL | Status: DC | PRN

## 2019-08-03 MED ORDER — SODIUM CHLORIDE 0.9% IV SOLUTION: Freq: Once | INTRAVENOUS | Status: DC

## 2019-08-03 MED ORDER — OXYCODONE-ACETAMINOPHEN 5-325 MG PO TABS: 1.0000 | ORAL_TABLET | ORAL | Status: DC | PRN

## 2019-08-03 MED ORDER — COCONUT OIL OIL
1.0000 "application " | TOPICAL_OIL | Status: DC | PRN
  Filled 2019-08-03: qty 120

## 2019-08-03 MED ORDER — FENTANYL 2.5 MCG/ML W/ROPIVACAINE 0.15% IN NS 100 ML EPIDURAL (ARMC)
12.0000 mL/h | EPIDURAL | Status: DC
  Administered 2019-08-03: 12 mL/h via EPIDURAL

## 2019-08-03 MED ORDER — LACTATED RINGERS IV SOLN: 500.0000 mL | INTRAVENOUS | Status: DC | PRN

## 2019-08-03 MED ORDER — SOD CITRATE-CITRIC ACID 500-334 MG/5ML PO SOLN: 30.0000 mL | ORAL | Status: DC | PRN

## 2019-08-03 MED ORDER — EPHEDRINE 5 MG/ML INJ
10.0000 mg | INTRAVENOUS | Status: DC | PRN
  Filled 2019-08-03: qty 2

## 2019-08-03 MED ORDER — LIDOCAINE-EPINEPHRINE (PF) 1.5 %-1:200000 IJ SOLN
INTRAMUSCULAR | Status: DC | PRN
  Administered 2019-08-03: 3 mL via EPIDURAL

## 2019-08-03 MED ORDER — DIBUCAINE (PERIANAL) 1 % EX OINT: 1.0000 "application " | TOPICAL_OINTMENT | CUTANEOUS | Status: DC | PRN

## 2019-08-03 MED ORDER — DIPHENHYDRAMINE HCL 25 MG PO CAPS: 25.0000 mg | ORAL_CAPSULE | Freq: Four times a day (QID) | ORAL | Status: DC | PRN

## 2019-08-03 MED ORDER — ACETAMINOPHEN 325 MG PO TABS: 650.0000 mg | ORAL_TABLET | ORAL | Status: DC | PRN

## 2019-08-03 MED ORDER — ONDANSETRON HCL 4 MG/2ML IJ SOLN: 4.0000 mg | Freq: Four times a day (QID) | INTRAMUSCULAR | Status: DC | PRN

## 2019-08-03 MED ORDER — ONDANSETRON HCL 4 MG/2ML IJ SOLN: 4.0000 mg | INTRAMUSCULAR | Status: DC | PRN

## 2019-08-03 MED ORDER — OXYTOCIN 40 UNITS IN NORMAL SALINE INFUSION - SIMPLE MED
INTRAVENOUS | Status: AC
  Administered 2019-08-03: 2 m[IU]/min via INTRAVENOUS
  Filled 2019-08-03: qty 1000

## 2019-08-03 MED ORDER — SENNOSIDES-DOCUSATE SODIUM 8.6-50 MG PO TABS
2.0000 | ORAL_TABLET | ORAL | Status: DC
  Administered 2019-08-04: 2 via ORAL
  Filled 2019-08-03 (×3): qty 2

## 2019-08-03 MED ORDER — OXYCODONE-ACETAMINOPHEN 5-325 MG PO TABS: 2.0000 | ORAL_TABLET | ORAL | Status: DC | PRN

## 2019-08-03 MED ORDER — AMMONIA AROMATIC IN INHA
RESPIRATORY_TRACT | Status: AC
  Filled 2019-08-03: qty 10

## 2019-08-03 MED ORDER — OXYTOCIN BOLUS FROM INFUSION
500.0000 mL | Freq: Once | INTRAVENOUS | Status: AC
  Administered 2019-08-03: 500 mL via INTRAVENOUS

## 2019-08-03 MED ORDER — WITCH HAZEL-GLYCERIN EX PADS
1.0000 "application " | MEDICATED_PAD | CUTANEOUS | Status: DC | PRN
  Filled 2019-08-03: qty 100

## 2019-08-03 MED ORDER — OXYTOCIN 40 UNITS IN NORMAL SALINE INFUSION - SIMPLE MED: 2.5000 [IU]/h | INTRAVENOUS | Status: DC

## 2019-08-03 MED ORDER — BENZOCAINE-MENTHOL 20-0.5 % EX AERO: 1.0000 "application " | INHALATION_SPRAY | CUTANEOUS | Status: DC | PRN

## 2019-08-03 MED ORDER — SODIUM CHLORIDE 0.9 % IV SOLN
INTRAVENOUS | Status: DC | PRN
  Administered 2019-08-03 (×2): 5 mL via EPIDURAL

## 2019-08-03 MED ORDER — MISOPROSTOL 200 MCG PO TABS
ORAL_TABLET | ORAL | Status: AC
  Filled 2019-08-03: qty 4

## 2019-08-03 MED ORDER — FENTANYL 2.5 MCG/ML W/ROPIVACAINE 0.15% IN NS 100 ML EPIDURAL (ARMC)
EPIDURAL | Status: AC
  Filled 2019-08-03: qty 100

## 2019-08-03 MED ORDER — LIDOCAINE HCL (PF) 1 % IJ SOLN
INTRAMUSCULAR | Status: AC
  Filled 2019-08-03: qty 30

## 2019-08-03 MED ORDER — LACTATED RINGERS IV SOLN
500.0000 mL | Freq: Once | INTRAVENOUS | Status: AC
  Administered 2019-08-03: 500 mL via INTRAVENOUS

## 2019-08-03 MED ORDER — DIPHENHYDRAMINE HCL 50 MG/ML IJ SOLN: 12.5000 mg | INTRAMUSCULAR | Status: DC | PRN

## 2019-08-03 MED ORDER — PRENATAL MULTIVITAMIN CH
1.0000 | ORAL_TABLET | Freq: Every day | ORAL | Status: DC
  Administered 2019-08-04 – 2019-08-07 (×4): 1 via ORAL
  Filled 2019-08-03 (×4): qty 1

## 2019-08-03 MED ORDER — LACTATED RINGERS IV SOLN
INTRAVENOUS | Status: DC
  Administered 2019-08-03: 980 mL via INTRAVENOUS
  Administered 2019-08-03: 17:00:00 via INTRAVENOUS

## 2019-08-03 MED ORDER — TERBUTALINE SULFATE 1 MG/ML IJ SOLN: 0.2500 mg | Freq: Once | INTRAMUSCULAR | Status: DC | PRN

## 2019-08-03 MED ORDER — LIDOCAINE HCL (PF) 1 % IJ SOLN
INTRAMUSCULAR | Status: DC | PRN
  Administered 2019-08-03: 4 mL via SUBCUTANEOUS

## 2019-08-03 MED ORDER — LIDOCAINE HCL (PF) 1 % IJ SOLN: 30.0000 mL | INTRAMUSCULAR | Status: DC | PRN

## 2019-08-03 NOTE — OB Triage Note (Signed)
Patient is here for LOF that happened at 815. Patient states that she is having some tightness in her stomach and some low back pain. But nothing with a pattern. Denies bleeding.

## 2019-08-03 NOTE — Progress Notes (Signed)
Subjective:  No concerns, comfortable breathing through contractions  Objective:   Vitals: Blood pressure (!) 145/94, pulse (!) 104, temperature 98.7 F (37.1 C), temperature source Oral, resp. rate 20, height 5\' 2"  (1.575 m), weight 92.1 kg. General: NAD Abdomen: gravid, non-tende Cervical Exam:  Dilation: 6 Effacement (%): 100 Cervical Position: Middle, Anterior Station: -1, 0 Presentation: Vertex Exam by:: Deitrich Steve  FHT: 145, moderate, +accels, occasional late vs early Toco: q7min  Results for orders placed or performed during the hospital encounter of 08/03/19 (from the past 24 hour(s))  SARS Coronavirus 2 by RT PCR (hospital order, performed in [facility] hospital lab) Nasopharyngeal Nasopharyngeal Swab     Status: None   Collection Time: 08/03/19 10:25 AM   Specimen: Nasopharyngeal Swab  Result Value Ref Range   SARS Coronavirus 2 NEGATIVE NEGATIVE  Chlamydia/NGC rt PCR ([facility] only)     Status: None   Collection Time: 08/03/19 10:44 AM   Specimen: Cervical/Vaginal swab  Result Value Ref Range   Specimen source GC/Chlam ENDOCERVICAL    Chlamydia Tr NOT DETECTED NOT DETECTED   N gonorrhoeae NOT DETECTED NOT DETECTED  Protein / creatinine ratio, urine     Status: Abnormal   Collection Time: 08/03/19 10:44 AM  Result Value Ref Range   Creatinine, Urine 159 mg/dL   Total Protein, Urine 47 mg/dL   Protein Creatinine Ratio 0.30 (H) 0.00 - 0.15 mg/mg[Cre]  Urine Drug Screen, Qualitative ([facility] only)     Status: None   Collection Time: 08/03/19 10:44 AM  Result Value Ref Range   Tricyclic, Ur Screen NONE DETECTED NONE DETECTED   Amphetamines, Ur Screen NONE DETECTED NONE DETECTED   MDMA (Ecstasy)Ur Screen NONE DETECTED NONE DETECTED   Cocaine Metabolite,Ur [place] NONE DETECTED NONE DETECTED   Opiate, Ur Screen NONE DETECTED NONE DETECTED   Phencyclidine (PCP) Ur S NONE DETECTED NONE DETECTED   Cannabinoid 50 Ng, Ur [place] NONE DETECTED NONE DETECTED   Barbiturates, Ur Screen  NONE DETECTED NONE DETECTED   Benzodiazepine, Ur Scrn NONE DETECTED NONE DETECTED   Methadone Scn, Ur NONE DETECTED NONE DETECTED  CBC     Status: Abnormal   Collection Time: 08/03/19 10:57 AM  Result Value Ref Range   WBC 9.2 4.0 - 10.5 K/uL   RBC 3.46 (L) 3.87 - 5.11 MIL/uL   Hemoglobin 8.9 (L) 12.0 - 15.0 g/dL   HCT 28.5 (L) 36.0 - 46.0 %   MCV 82.4 80.0 - 100.0 fL   MCH 25.7 (L) 26.0 - 34.0 pg   MCHC 31.2 30.0 - 36.0 g/dL   RDW 16.3 (H) 11.5 - 15.5 %   Platelets 265 150 - 400 K/uL   nRBC 0.0 0.0 - 0.2 %  Type and screen [facility]     Status: None (Preliminary result)   Collection Time: 08/03/19 10:57 AM  Result Value Ref Range   ABO/RH(D) O POS    Antibody Screen NEG    Sample Expiration      08/06/2019,2359 Performed at [facility], [address], [place], [place] [address]    Unit Number [id number]    Blood Component Type RED CELLS,LR    Unit division 00    Status of Unit ALLOCATED    Transfusion Status OK TO TRANSFUSE    Crossmatch Result Compatible    Unit Number [id number]    Blood Component Type RED CELLS,LR    Unit division 00    Status of Unit ALLOCATED    Transfusion  Status OK TO TRANSFUSE    Crossmatch Result Compatible   Comprehensive metabolic panel     Status: Abnormal   Collection Time: 08/03/19 10:57 AM  Result Value Ref Range   Sodium 137 135 - 145 mmol/L   Potassium 3.6 3.5 - 5.1 mmol/L   Chloride 108 98 - 111 mmol/L   CO2 20 (L) 22 - 32 mmol/L   Glucose, Bld 86 70 - 99 mg/dL   BUN 7 6 - 20 mg/dL   Creatinine, Ser 0.10 0.44 - 1.00 mg/dL   Calcium 8.5 (L) 8.9 - 10.3 mg/dL   Total Protein 6.9 6.5 - 8.1 g/dL   Albumin 2.9 (L) 3.5 - 5.0 g/dL   AST 18 15 - 41 U/L   ALT 14 0 - 44 U/L   Alkaline Phosphatase 141 (H) 38 - 126 U/L   Total Bilirubin 0.5 0.3 - 1.2 mg/dL   GFR calc non Af Amer >60 >60 mL/min   GFR calc Af Amer >60 >60 mL/min   Anion gap 9 5 - 15  Prepare RBC     Status: None   Collection Time:  08/03/19  1:18 PM  Result Value Ref Range   Order Confirmation      ORDER PROCESSED BY BLOOD BANK Performed at [facility], [address] [place], [place] [address]   ABO/Rh     Status: None   Collection Time: 08/03/19  1:33 PM  Result Value Ref Range   ABO/RH(D)      O POS Performed at [facility], [address], [place], [place] [address]     Assessment:   23 y.o. [name] [redacted]w[redacted]d IOL GHTN  Plan:   1) Labor - continue pitocin, good cervical change  2) Fetus - category I, occasional periods of category II but overall reassuring tracing  3) GHTN - mild range BP, normal labs  4) Disposition - pending delivery  Vena Austria, MD, [facility], [facility] 08/03/2019, 3:56 PM

## 2019-08-03 NOTE — H&P (Signed)
Obstetric H&P   Chief Complaint: Leaking fluid  Prenatal Care Provider: [facility]  History of Present Illness: 23 y.o. [name] [redacted]w[redacted]d by 08/13/2019, by Ultrasound presenting to L&D with concerns of leaking fluid.  No evidence of SROM on exam.  Is having some contractions.  No VB, +FM.  On presentation initial BP's noted to be elevated.  No HA, VC, RUQ/epgiastric pain.    Pregravid weight 80.3 kg Total Weight Gain 11.8 kg  pregnancy Problems (from 01/24/19 to present)    Problem Noted Resolved   Supervision of other normal pregnancy, antepartum 01/24/2019 by Tresea Mall, CNM No   Overview Addendum 07/23/2019  9:07 AM by Tresea Mall, CNM    Clinic [facility] Prenatal Labs  Dating Ultrasound at [redacted]w[redacted]d Blood type: O/Positive/-- (05/22 0928)   Genetic Screen Declines Antibody:Negative (05/22 0928)  Anatomic [place] Complete 7/17 Rubella: 2.05 (05/22 0928) Varicella: NONIMMUNE  GTT Third trimester: 116 RPR: Non Reactive (05/22 0928)   Rhogam  not needed HBsAg: Negative (05/22 0928)   TDaP vaccine  9/25       Flu Shot:declines HIV: Non Reactive (05/22 0928)   Baby Food Plans to initiate breastfeeding                               GBS: negative  Contraception Mini-pill Pap: 08/10/2017, NILM  CBB  No Pelvis tested to 2722g (6lbs)  CS/VBAC NA   Support Person [name]              Review of Systems: 10 point review of systems negative unless otherwise noted in HPI  Past Medical History: History reviewed. No pertinent past medical history.  Past Surgical History: Past Surgical History:  Procedure Laterality Date  . NO PAST SURGERIES      Past Obstetric History: # 1 - Date: 11/13/14, Sex: Female, Weight: 2722 g, GA: [redacted]w[redacted]d, Delivery: Vaginal, Spontaneous, Apgar1: None, Apgar5: None, Living: Living, Birth Comments: None  # 2 - Date: None, Sex: None, Weight: None, GA: None, Delivery: None, Apgar1: None, Apgar5: None, Living: None, Birth Comments: None   Past Gynecologic History:  Family  History: Family History  Problem Relation Age of Onset  . Hypertension Mother   . Asthma Father   . Breast cancer Maternal Grandmother   . Hypertension Maternal Grandmother     Social History: Social History   Socioeconomic History  . Marital status: Married    Spouse name: Not on file  . Number of children: 1  . Years of education: 70  . Highest education level: Not on file  Occupational History  . Occupation: FOOD SERVICES  Social Needs  . Financial resource strain: Not on file  . Food insecurity    Worry: Not on file    Inability: Not on file  . Transportation needs    Medical: Not on file    Non-medical: Not on file  Tobacco Use  . Smoking status: Never Smoker  . Smokeless tobacco: Never Used  Substance and Sexual Activity  . Alcohol use: No    Frequency: Never  . Drug use: No  . Sexual activity: Yes    Birth control/protection: None  Lifestyle  . Physical activity    Days per week: Not on file    Minutes per session: Not on file  . Stress: Not on file  Relationships  . Social Musician on phone: Not on file    Gets together: Not  on file    Attends religious service: Not on file    Active member of club or organization: Not on file    Attends meetings of clubs or organizations: Not on file    Relationship status: Not on file  . Intimate partner violence    Fear of current or ex partner: Not on file    Emotionally abused: Not on file    Physically abused: Not on file    Forced sexual activity: Not on file  Other Topics Concern  . Not on file  Social History Narrative  . Not on file    Medications: Prior to Admission medications   Medication Sig Start Date End Date Taking? Authorizing Provider  Prenatal Vit-Fe Fumarate-FA (PRENATAL VITAMIN PO) Take by mouth.   Yes [provider]  cyclobenzaprine (FLEXERIL) 5 MG tablet Take 2.5 mg by mouth daily as needed for muscle spasms.    [provider]    Allergies: No Known  Allergies  Physical Exam: Vitals: Blood pressure (!) 137/92, pulse 98.  Urine Dip Protein: P/C ratio pending  FHT: 150, moderate, no accels, occasional decelerations at least one late in appearance Toco: q34min  General: NAD HEENT: normocephalic, anicteric Pulmonary: No increased work of breathing Cardiovascular: RRR, distal pulses 2+ Abdomen: Gravid, non-tender [place]: vtx Genitourinary: Dilation: 4 Effacement (%): 90 Cervical Position: Middle, Anterior Station: -1 Presentation: Vertex Exam by:: LSE RN   Extremities: no edema, erythema, or tenderness Neurologic: Grossly intact Psychiatric: mood appropriate, affect full  Labs: No results found for this or any previous visit (from the past 24 hour(s)).  Assessment: 23 y.o. [name] [redacted]w[redacted]d by 08/13/2019, by Ultrasound presenting with elevated BP at >30 weeks  Plan: 1) GHTN vs preeclampsia without severe features - preeclampsia labs and UDS sent - proceed with IOL vs augmentation of labor once covid swab returns  2) Fetus - cat II tracing  3) PNL - Blood type O/Positive/-- (05/22 0928) / Anti-bodyscreen Negative (05/22 0928) / Rubella 2.05 (05/22 0928) / Varicella non-immune / RPR Non Reactive (09/11 1110) / HBsAg Negative (05/22 1914) / HIV Non Reactive (09/11 1110) / 1-hr OGTT 110 / GBS --[place] (11/19 1633)  - varivax postpartum  4) Immunization History -  Immunization History  Administered Date(s) Administered  . Tdap 06/06/2019    5) Disposition - pending delivery  Malachy Mood, MD, [place], [facility] 08/03/2019, 10:35 AM

## 2019-08-03 NOTE — Anesthesia Preprocedure Evaluation (Signed)
Anesthesia Evaluation  Patient identified by MRN, date of birth, ID band Patient awake    Reviewed: Allergy & Precautions, H&P , NPO status , Patient's Chart, lab work & pertinent test results, reviewed documented beta blocker date and time   History of Anesthesia Complications Negative for: history of anesthetic complications  Airway Mallampati: IV  TM Distance: >3 FB Neck ROM: full    Dental  (+) Dental Advidsory Given, Teeth Intact   Pulmonary neg pulmonary ROS,    Pulmonary exam normal        Cardiovascular Exercise Tolerance: Good hypertension (gestational), Normal cardiovascular exam     Neuro/Psych negative neurological ROS  negative psych ROS   GI/Hepatic negative GI ROS, Neg liver ROS,   Endo/Other  negative endocrine ROS  Renal/GU negative Renal ROS  negative genitourinary   Musculoskeletal   Abdominal   Peds  Hematology negative hematology ROS (+)   Anesthesia Other Findings History reviewed. No pertinent past medical history.   Reproductive/Obstetrics (+) Pregnancy                             Anesthesia Physical Anesthesia Plan  ASA: II  Anesthesia Plan: Epidural   Post-op Pain Management:    Induction:   PONV Risk Score and Plan:   Airway Management Planned:   Additional Equipment:   Intra-op Plan:   Post-operative Plan:   Informed Consent: I have reviewed the patients History and Physical, chart, labs and discussed the procedure including the risks, benefits and alternatives for the proposed anesthesia with the patient or authorized representative who has indicated his/her understanding and acceptance.     Dental Advisory Given  Plan Discussed with: Anesthesiologist, CRNA and Surgeon  Anesthesia Plan Comments:         Anesthesia Quick Evaluation

## 2019-08-03 NOTE — Progress Notes (Signed)
Subjective:  Comfortable, epidural now in place  Objective:   Vitals: Blood pressure 131/84, pulse 79, temperature 98.7 F (37.1 C), temperature source Oral, resp. rate 20, height 5\' 2"  (1.575 m), weight 92.1 kg. General: NAD Abdomen: gravid, non-tender Cervical Exam:  Dilation: 9 Effacement (%): 100 Cervical Position: Middle, Anterior Station: Plus 1 Presentation: Vertex Exam by:: Argel Pablo  FHT: 140, moderate, no accels, rare early or variable present Toco: q1-89min  Results for orders placed or performed during the hospital encounter of 08/03/19 (from the past 24 hour(s))  SARS Coronavirus 2 by RT PCR (hospital order, performed in [facility] hospital lab) Nasopharyngeal Nasopharyngeal Swab     Status: None   Collection Time: 08/03/19 10:25 AM   Specimen: Nasopharyngeal Swab  Result Value Ref Range   SARS Coronavirus 2 NEGATIVE NEGATIVE  Chlamydia/NGC rt PCR ([facility] only)     Status: None   Collection Time: 08/03/19 10:44 AM   Specimen: Cervical/Vaginal swab  Result Value Ref Range   Specimen source GC/Chlam ENDOCERVICAL    Chlamydia Tr NOT DETECTED NOT DETECTED   N gonorrhoeae NOT DETECTED NOT DETECTED  Protein / creatinine ratio, urine     Status: Abnormal   Collection Time: 08/03/19 10:44 AM  Result Value Ref Range   Creatinine, Urine 159 mg/dL   Total Protein, Urine 47 mg/dL   Protein Creatinine Ratio 0.30 (H) 0.00 - 0.15 mg/mg[Cre]  Urine Drug Screen, Qualitative ([facility] only)     Status: None   Collection Time: 08/03/19 10:44 AM  Result Value Ref Range   Tricyclic, Ur Screen NONE DETECTED NONE DETECTED   Amphetamines, Ur Screen NONE DETECTED NONE DETECTED   MDMA (Ecstasy)Ur Screen NONE DETECTED NONE DETECTED   Cocaine Metabolite,Ur [place] NONE DETECTED NONE DETECTED   Opiate, Ur Screen NONE DETECTED NONE DETECTED   Phencyclidine (PCP) Ur S NONE DETECTED NONE DETECTED   Cannabinoid 50 Ng, Ur [place] NONE DETECTED NONE DETECTED   Barbiturates, Ur Screen NONE DETECTED NONE  DETECTED   Benzodiazepine, Ur Scrn NONE DETECTED NONE DETECTED   Methadone Scn, Ur NONE DETECTED NONE DETECTED  CBC     Status: Abnormal   Collection Time: 08/03/19 10:57 AM  Result Value Ref Range   WBC 9.2 4.0 - 10.5 K/uL   RBC 3.46 (L) 3.87 - 5.11 MIL/uL   Hemoglobin 8.9 (L) 12.0 - 15.0 g/dL   HCT 08/05/19 (L) 17.0 - 01.7 %   MCV 82.4 80.0 - 100.0 fL   MCH 25.7 (L) 26.0 - 34.0 pg   MCHC 31.2 30.0 - 36.0 g/dL   RDW 49.4 (H) 49.6 - 75.9 %   Platelets 265 150 - 400 K/uL   nRBC 0.0 0.0 - 0.2 %  Type and screen [facility]     Status: None (Preliminary result)   Collection Time: 08/03/19 10:57 AM  Result Value Ref Range   ABO/RH(D) O POS    Antibody Screen NEG    Sample Expiration      08/06/2019,2359 Performed at [facility], [address], [place], [place]    Unit Number [id number]    Blood Component Type RED CELLS,LR    Unit division 00    Status of Unit ALLOCATED    Transfusion Status OK TO TRANSFUSE    Crossmatch Result Compatible    Unit Number [id number]    Blood Component Type RED CELLS,LR    Unit division 00    Status of Unit ALLOCATED    Transfusion Status  OK TO TRANSFUSE    Crossmatch Result Compatible   Comprehensive metabolic panel     Status: Abnormal   Collection Time: 08/03/19 10:57 AM  Result Value Ref Range   Sodium 137 135 - 145 mmol/L   Potassium 3.6 3.5 - 5.1 mmol/L   Chloride 108 98 - 111 mmol/L   CO2 20 (L) 22 - 32 mmol/L   Glucose, Bld 86 70 - 99 mg/dL   BUN 7 6 - 20 mg/dL   Creatinine, Ser 0.73 0.44 - 1.00 mg/dL   Calcium 8.5 (L) 8.9 - 10.3 mg/dL   Total Protein 6.9 6.5 - 8.1 g/dL   Albumin 2.9 (L) 3.5 - 5.0 g/dL   AST 18 15 - 41 U/L   ALT 14 0 - 44 U/L   Alkaline Phosphatase 141 (H) 38 - 126 U/L   Total Bilirubin 0.5 0.3 - 1.2 mg/dL   GFR calc non Af Amer >60 >60 mL/min   GFR calc Af Amer >60 >60 mL/min   Anion gap 9 5 - 15  Prepare RBC     Status: None   Collection Time: 08/03/19  1:18 PM   Result Value Ref Range   Order Confirmation      ORDER PROCESSED BY BLOOD BANK Performed at [facility], [address], [place], [place] [address]   ABO/Rh     Status: None   Collection Time: 08/03/19  1:33 PM  Result Value Ref Range   ABO/RH(D)      O POS Performed at [facility], [address], [place], [place] [address]     Assessment:   23 y.o. [name] [redacted]w[redacted]d IOL GHTN  Plan:   1) Labor - continue pitocin, making good cervical change  2) Fetus - cat I tracing  3) GHTN - normotensive to mild range BP  Malachy Mood, MD, [place], [facility] 08/03/2019, 5:40 PM

## 2019-08-03 NOTE — Anesthesia Procedure Notes (Signed)
Epidural Patient location during procedure: OB Start time: 08/03/2019 4:41 PM End time: 08/03/2019 4:49 PM  Staffing Anesthesiologist: Martha Clan, MD Performed: anesthesiologist   Preanesthetic Checklist Completed: patient identified, site marked, surgical consent, pre-op evaluation, timeout performed, IV checked, risks and benefits discussed and monitors and equipment checked  Epidural Patient position: sitting Prep: ChloraPrep Patient monitoring: heart rate, continuous pulse ox and blood pressure Approach: midline Location: L3-L4 Injection technique: LOR saline  Needle:  Needle type: Tuohy  Needle gauge: 17 G Needle length: 9 cm and 9 Needle insertion depth: 6 cm Catheter type: closed end flexible Catheter size: 19 Gauge Catheter at skin depth: 11 cm Test dose: negative and 1.5% lidocaine with Epi 1:200 K  Assessment Sensory level: T10 Events: blood not aspirated, injection not painful, no injection resistance, negative IV test and no paresthesia  Additional Notes 1st attempt Pt. Evaluated and documentation done after procedure finished. Patient identified. Risks/Benefits/Options discussed with patient including but not limited to bleeding, infection, nerve damage, paralysis, failed block, incomplete pain control, headache, blood pressure changes, nausea, vomiting, reactions to medication both or allergic, itching and postpartum back pain. Confirmed with bedside nurse the patient's most recent platelet count. Confirmed with patient that they are not currently taking any anticoagulation, have any bleeding history or any family history of bleeding disorders. Patient expressed understanding and wished to proceed. All questions were answered. Sterile technique was used throughout the entire procedure. Please see nursing notes for vital signs. Test dose was given through epidural catheter and negative prior to continuing to dose epidural or start infusion. Warning signs of high  block given to the patient including shortness of breath, tingling/numbness in hands, complete motor block, or any concerning symptoms with instructions to call for help. Patient was given instructions on fall risk and not to get out of bed. All questions and concerns addressed with instructions to call with any issues or inadequate analgesia.   Patient tolerated the insertion well without immediate complications.Reason for block:procedure for pain

## 2019-08-03 NOTE — Discharge Summary (Addendum)
Obstetric Discharge Summary Reason for Admission: induction of labor, preeclampsia without severe features (mild preeclampsia) Prenatal Procedures: none Intrapartum Procedures: spontaneous vaginal delivery Postpartum Procedures: varicella vaccine Complications-Operative and Postpartum: none   Hospital course: admitted for induction of labor due to mild preeclampsia. Went on to have uncomplicated SVD.  Her postpartum course was complicated by mostly mild-range (though on the high end) blood pressures.  She was started on labetalol and gradually increased to 300 mg po tid.  Amlodipine 5 mg PO Q day was added on PPD#4.  On PPD#5 she was ambulating, tolerating PO, her pain was well controlled on PO pain medication, she was voiding spontaneously.  She denies headaches, visual changes, and RUQ pain.  Her BPs were mostly normal with occasional mid mild-range BPs.  She was discharged with strict instructions to follow up if she developed headaches, visual changes, and RUQ pain or BPs >150/100.  Hemoglobin  Date Value Ref Range Status  08/04/2019 8.9 (L) 12.0 - 15.0 g/dL Final  05/23/2019 10.5 (L) 11.1 - 15.9 g/dL Final   HCT  Date Value Ref Range Status  08/04/2019 28.3 (L) 36.0 - 46.0 % Final   Hematocrit  Date Value Ref Range Status  05/23/2019 31.8 (L) 34.0 - 46.6 % Final    Physical Exam:  General: alert, appears stated age and no distress  CV: RRR Pulm: CTAB Lochia: appropriate Uterine Fundus: firm DVT Evaluation: No evidence of DVT seen on physical exam. No cords or calf tenderness. No significant calf/ankle edema.  Discharge Diagnoses: Term Pregnancy-delivered and mild preeclampsia  Discharge Information: Date: 08/08/2019 Activity: pelvic rest Diet: routine Allergies as of 08/08/2019   No Known Allergies     Medication List    TAKE these medications   acetaminophen 325 MG tablet Commonly known as: Tylenol Take 3 tablets (975 mg total) by mouth every 4 (four) hours as  needed (for pain scale < 4).   amLODipine 5 MG tablet Commonly known as: NORVASC Take 1 tablet (5 mg total) by mouth daily. Start taking on: August 09, 2019   cyclobenzaprine 5 MG tablet Commonly known as: FLEXERIL Take 2.5 mg by mouth daily as needed for muscle spasms.   ibuprofen 600 MG tablet Commonly known as: ADVIL Take 1 tablet (600 mg total) by mouth every 6 (six) hours.   labetalol 100 MG tablet Commonly known as: NORMODYNE Take 3 tablets (300 mg total) by mouth 3 (three) times daily.   PRENATAL VITAMIN PO Take by mouth.            Discharge Care Instructions  (From admission, onward)         Start     Ordered   08/04/19 0000  Discharge wound care:    Comments: Perform wound care instructions   08/04/19 1221          Condition: stable Instructions: refer to practice specific booklet Discharge to: home Follow-up Information    Malachy Mood, MD Follow up on 08/11/2019.   Specialty: Obstetrics and Gynecology Why: BP check Contact information: [place] [address] [phone]           Newborn Data: Live born child  Birth Weight: 7 lb 5.5 oz (`3,330 grams) APGAR: 56, 9   Newborn Delivery   Birth date/time: 08/03/2019 19:34:00 Delivery type: Vaginal, Spontaneous      Home with mother.  Prentice Docker, MD 08/08/2019, 11:15 AM

## 2019-08-04 ENCOUNTER — Encounter: Payer: BC Managed Care – PPO | Admitting: Certified Nurse Midwife

## 2019-08-04 DIAGNOSIS — O1400 Mild to moderate pre-eclampsia, unspecified trimester: Secondary | ICD-10-CM | POA: Diagnosis present

## 2019-08-04 LAB — CBC
HCT: 28.3 % — ABNORMAL LOW (ref 36.0–46.0)
Hemoglobin: 8.9 g/dL — ABNORMAL LOW (ref 12.0–15.0)
MCH: 25.9 pg — ABNORMAL LOW (ref 26.0–34.0)
MCHC: 31.4 g/dL (ref 30.0–36.0)
MCV: 82.5 fL (ref 80.0–100.0)
Platelets: 234 10*3/uL (ref 150–400)
RBC: 3.43 MIL/uL — ABNORMAL LOW (ref 3.87–5.11)
RDW: 16.2 % — ABNORMAL HIGH (ref 11.5–15.5)
WBC: 12.8 10*3/uL — ABNORMAL HIGH (ref 4.0–10.5)
nRBC: 0 % (ref 0.0–0.2)

## 2019-08-04 LAB — RPR: RPR Ser Ql: NONREACTIVE

## 2019-08-04 MED ORDER — IBUPROFEN 600 MG PO TABS: 600.0000 mg | ORAL_TABLET | Freq: Four times a day (QID) | ORAL | 0 refills | Status: DC

## 2019-08-04 MED ORDER — ACETAMINOPHEN 325 MG PO TABS: 1000.0000 mg | ORAL_TABLET | ORAL | Status: DC | PRN

## 2019-08-04 NOTE — Lactation Note (Signed)
This note was copied from a baby's chart. Lactation Consultation Note  Patient Name: Mary Schwartz [id number] Date: 08/04/2019   [facility] had assisted mom after delivery and [name] had breast fed well, but mom needed lots of assistance with positioning, sandwiching and teacuping breast as well as holding baby close to breast.  Mom has flat nipples that slightly invert with compression.  Mom decided through the night to just bottle feed formula.  Discussed with mom what her immediate and future lactation plans were.  Mom reports only breast feeding first baby for 1 week and during that time she struggled with latching him even with a nipple shield. Mom does not want to go through that again.  Mom reports [name] is taking bottles of formula well so plans to stick with that plan.  She declines pumping even after discussing how she could get DEBP through her [employer].  Discussed how to dry up her milk if this was her final decision to not breast feed or pump breast.  Discussed differences and treatments of full breasts, engorgement and mastitis.  Lactation name and number given and encouraged to call if she had further questions or needed lactation.  Maternal Data    Feeding Feeding Type: Bottle Fed - Formula Nipple Type: Slow - flow  LATCH Score                   Interventions    Lactation Tools Discussed/Used     Consult Status      Jarold Motto 08/04/2019, 4:40 PM

## 2019-08-04 NOTE — Anesthesia Postprocedure Evaluation (Signed)
Anesthesia Post Note  Patient: Mary Schwartz  Procedure(s) Performed: AN AD HOC LABOR EPIDURAL  Patient location during evaluation: Mother Baby Anesthesia Type: Epidural Level of consciousness: awake and alert and oriented Pain management: pain level controlled Vital Signs Assessment: post-procedure vital signs reviewed and stable Respiratory status: spontaneous breathing and respiratory function stable Cardiovascular status: stable Postop Assessment: no headache, no backache, no apparent nausea or vomiting, able to ambulate, adequate PO intake and patient able to bend at knees Anesthetic complications: no     Last Vitals:  Vitals:   08/04/19 0015 08/04/19 0337  BP: (!) 155/92 (!) 122/91  Pulse:  72  Resp:  18  Temp:  36.9 C  SpO2:  100%    Last Pain:  Vitals:   08/04/19 0337  TempSrc: Oral  PainSc:                  Mary Schwartz

## 2019-08-05 LAB — BPAM RBC
Blood Product Expiration Date: 202012162359
Blood Product Expiration Date: 202012232359
Unit Type and Rh: 5100
Unit Type and Rh: 5100

## 2019-08-05 LAB — TYPE AND SCREEN
ABO/RH(D): O POS
Antibody Screen: NEGATIVE
Unit division: 0
Unit division: 0

## 2019-08-05 LAB — PREPARE RBC (CROSSMATCH)

## 2019-08-05 MED ORDER — FERROUS SULFATE 325 (65 FE) MG PO TABS
325.0000 mg | ORAL_TABLET | Freq: Every day | ORAL | Status: DC
  Administered 2019-08-05 – 2019-08-08 (×4): 325 mg via ORAL
  Filled 2019-08-05 (×4): qty 1

## 2019-08-05 MED ORDER — LABETALOL HCL 200 MG PO TABS
200.0000 mg | ORAL_TABLET | Freq: Two times a day (BID) | ORAL | Status: DC
  Administered 2019-08-05 (×2): 200 mg via ORAL
  Filled 2019-08-05 (×2): qty 1

## 2019-08-05 NOTE — Progress Notes (Signed)
Varicella vaccine offered to patient at this time. Patient refused varicella vaccine.   Hilbert Bible, RN

## 2019-08-05 NOTE — Progress Notes (Signed)
Post Partum Day 2 Subjective: up ad lib, voiding, tolerating PO and bleeding slowing. Had a transient frontal headache this AM. No visual changes, CP, SOB, RUQ pain  Objective: Blood pressure (!) 152/101, pulse 69, temperature 98 F (36.7 C), temperature source Oral, resp. rate 20, height 5\' 2"  (1.575 m), weight 92.1 kg, SpO2 99 %, unknown if currently breastfeeding. Patient Vitals for the past 24 hrs:  BP Temp Temp src Pulse Resp SpO2  08/05/19 0800 (!) 152/101 98 F (36.7 C) Oral 69 20 99 %  08/05/19 0329 (!) 129/101 97.9 F (36.6 C) Oral 76 18 100 %  08/05/19 0024 (!) 135/99 - - 70 - -  08/04/19 2326 (!) 136/104 98.5 F (36.9 C) Oral 76 18 100 %  08/04/19 1917 130/88 98.3 F (36.8 C) Oral 75 18 100 %  08/04/19 1638 (!) 135/97 98.2 F (36.8 C) Oral 75 18 100 %  08/04/19 1131 (!) 138/91 98.5 F (36.9 C) Oral 79 18 98 %  08/04/19 1014 135/86 - - 77 - -   Physical Exam:  General: alert, cooperative and no distress  Heart: RRR without murmur Lochia: appropriate Uterine Fundus: firm at U to U-1/ML/NT DVT Evaluation: No evidence of DVT seen on physical exam. No significant calf/ankle edema.  Recent Labs    08/03/19 1057 08/04/19 0540  HGB 8.9* 8.9*  HCT 28.5* 28.3*  WBC 9.2 12.8*  PLT 265 234    Assessment/Plan: PPD #2-continue pp care Preeclampsia without severe features with blood pressure elevations  Start labetalol 200 mgm BID  Monitor blood pressures every 4 hours O POS/ RI/VNI-offer varicella vaccine at discharge Now bottle feeding TDAP and flu vaccine UTD Minipill?  Dalia Heading, CNM   LOS: 2 days   Dalia Heading 08/05/2019, 8:40 AM

## 2019-08-05 NOTE — Discharge Instructions (Signed)

## 2019-08-06 MED ORDER — LABETALOL HCL 200 MG PO TABS: 300.0000 mg | ORAL_TABLET | Freq: Two times a day (BID) | ORAL | Status: DC

## 2019-08-06 MED ORDER — LABETALOL HCL 200 MG PO TABS
200.0000 mg | ORAL_TABLET | Freq: Three times a day (TID) | ORAL | Status: DC
  Administered 2019-08-06 – 2019-08-07 (×2): 200 mg via ORAL
  Filled 2019-08-06 (×2): qty 1

## 2019-08-06 MED ORDER — LABETALOL HCL 100 MG PO TABS
300.0000 mg | ORAL_TABLET | Freq: Two times a day (BID) | ORAL | Status: DC
  Administered 2019-08-06: 300 mg via ORAL
  Filled 2019-08-06: qty 3

## 2019-08-06 MED ORDER — LABETALOL HCL 200 MG PO TABS
200.0000 mg | ORAL_TABLET | Freq: Once | ORAL | Status: AC
  Administered 2019-08-06: 16:00:00 200 mg via ORAL
  Filled 2019-08-06: qty 1

## 2019-08-06 NOTE — Progress Notes (Signed)
Post Partum Day 3 Subjective: no complaints, up ad lib, voiding and tolerating PO. Denies headaches, visual changes, chest pain, shortness of breath, abdominal pain. Bleeding slowing.  Objective: Blood pressure (!) 137/92, pulse 76, temperature 98.4 F (36.9 C), temperature source Oral, resp. rate 18, height 5\' 2"  (1.575 m), weight 92.1 kg, SpO2 98 %, unknown if currently breastfeeding. Patient Vitals for the past 24 hrs:  BP Temp Temp src Pulse Resp SpO2  08/06/19 0836 (!) 137/92 98.4 F (36.9 C) Oral 76 18 98 %  08/06/19 0829 - - - - - 99 %  08/06/19 0333 (!) 149/100 98.6 F (37 C) Oral 65 18 100 %  08/06/19 0003 (!) 159/107 98.5 F (36.9 C) Oral 66 18 100 %  08/05/19 2212 (!) 159/108 98.4 F (36.9 C) Oral 70 18 100 %  08/05/19 2024 (!) 134/92 98.7 F (37.1 C) Oral 75 18 100 %  08/05/19 2023 (!) 134/92 98.7 F (37.1 C) Oral 75 18 100 %  08/05/19 1800 129/86 - - - - -  08/05/19 1615 (!) 149/96 98.9 F (37.2 C) Oral - 18 99 %  08/05/19 1412 (!) 149/108 - - - - -  08/05/19 1209 (!) 130/94 98 F (36.7 C) Oral 69 20 98 %   Physical Exam:  General: alert, cooperative and no distress, appears comfortable Lochia: appropriate Uterine Fundus: firm/ U-1/ML/NT DVT Evaluation: No evidence of DVT seen on physical exam. Minimal edema lower extremities  Recent Labs    08/03/19 1057 08/04/19 0540  HGB 8.9* 8.9*  HCT 28.5* 28.3*  WBC 9.2 12.8*  PLT 265 234    Assessment/Plan: Stable PPD #3-continue postpartum care Preeclampsia without severe features  Blood pressures remain in the mild range although some blood pressures close to severe  Patient remains asymptomatic  Increased labetalol to 300 mgm BID (was 200 mgm BID)  Continue to monitor blood pressures closely Discharge pending better control of blood pressures O POS/ RI/ VNI Bottle feeding TDAP and flu vaccine UTD ?[place], CNM    LOS: 3 days   Dalia Heading 08/06/2019, 9:27 AM

## 2019-08-06 NOTE — Plan of Care (Signed)
Vs stable except increased BP; BP has been 150s/100s; (has not gotten to 160/110); pt on 200mg  labetalol BID; pt has no other symptoms of gestational hypertension (declines headache, blurry vision and epigastric pain); pt has some non-pitting edema to feet; formula feeding baby; has declined motrin this shift; pt up ad lib; tolerating regular diet

## 2019-08-06 NOTE — Plan of Care (Signed)
Pt continues to have elevated BP; CNM involved with labetalol dose and frequency; plan for tonight and tomorrow (and probably at discharge) is to now have 200mg  labetalol three times a day; CNM in room to discuss plan with pt

## 2019-08-07 MED ORDER — LABETALOL HCL 100 MG PO TABS
100.0000 mg | ORAL_TABLET | Freq: Once | ORAL | Status: AC
  Administered 2019-08-07: 09:00:00 100 mg via ORAL
  Filled 2019-08-07: qty 1

## 2019-08-07 MED ORDER — AMLODIPINE BESYLATE 5 MG PO TABS
5.0000 mg | ORAL_TABLET | Freq: Every day | ORAL | Status: DC
  Administered 2019-08-07 – 2019-08-08 (×2): 5 mg via ORAL
  Filled 2019-08-07 (×2): qty 1

## 2019-08-07 MED ORDER — LABETALOL HCL 200 MG PO TABS
300.0000 mg | ORAL_TABLET | Freq: Three times a day (TID) | ORAL | Status: DC
  Administered 2019-08-07 – 2019-08-08 (×3): 300 mg via ORAL
  Filled 2019-08-07 (×3): qty 1

## 2019-08-07 NOTE — Progress Notes (Signed)
   Subjective:  Patient is doing well on postpartum day 4. She denies any s/s of preeclampsia- no headaches, no visual changes, no epigastric pain. She is tolerating regular diet. Her pain is controlled with PO medications. She is ambulating and voiding without difficulty. I talked with the patient about the plan of care given the holiday and that pharmacies are closed. She would be unable to get BP medications until tomorrow if I sent her home. She verbalizes understanding and is ok with staying until tomorrow.   Her blood pressures have primarily remained in the mild range with an occasional normal and one severe range.   Objective:  Vital signs in last 24 hours: Temp:  [98.1 F (36.7 C)-99 F (37.2 C)] 99 F (37.2 C) (11/26 1611) Pulse Rate:  [60-89] 72 (11/26 1611) Resp:  [16-20] 18 (11/26 1611) BP: (126-159)/(83-110) 143/106 (11/26 1611) SpO2:  [99 %-100 %] 100 % (11/26 1611)   Patient Vitals for the past 24 hrs:  BP Temp Temp src Pulse Resp SpO2  08/07/19 1611 (!) 143/106 99 F (37.2 C) Oral 72 18 100 %  08/07/19 1407 (!) 141/94 - - 79 - -  08/07/19 1226 (!) 155/105 - - 64 - -  08/07/19 1204 (!) 157/110 98.4 F (36.9 C) Oral 64 18 -  08/07/19 1020 (!) 144/106 - - 65 - -  08/07/19 0928 (!) 159/99 - - 60 - -  08/07/19 0811 (!) 151/106 98.5 F (36.9 C) Oral 64 18 100 %  08/07/19 0314 126/83 98.1 F (36.7 C) Oral 79 20 100 %  08/06/19 2332 (!) 144/100 98.9 F (37.2 C) Oral 66 16 99 %  08/06/19 2210 (!) 137/100 - - 65 - -  08/06/19 1927 (!) 150/104 98.8 F (37.1 C) Oral 89 20 100 %      General: NAD Pulmonary: no increased work of breathing Abdomen: non-distended, non-tender, fundus firm at level of umbilicus Extremities: no edema, no erythema, no tenderness    Assessment:   23 y.o. G2P2002 postpartum day # 4, preeclampsia without severe features  Plan:   1) Preeclampsia without severe features: Labetalol increased to 300 mg TID Amlodipine 5 mg Q day added  2)  Acute blood loss anemia - hemodynamically stable and asymptomatic - po ferrous sulfate  3) Blood Type --/--/O POS Performed at [facility] [address] [place], [place] [address]  [phone]) / Rubella 2.05 (05/22 4801) / Varicella Non-Immune- declined vaccination   4) TDAP status up to date, declined flu vaccine  5) Feeding plan formula  6)  Education given regarding options for contraception, as well as compatibility with breast feeding if applicable.  Patient is considering birth control pill for contraception.  7) Disposition: discharge to home tomorrow likely   Rod Can, [facility] 08/07/2019, 6:12 PM

## 2019-08-08 MED ORDER — AMLODIPINE BESYLATE 5 MG PO TABS: 5.0000 mg | ORAL_TABLET | Freq: Every day | ORAL | 1 refills | Status: DC

## 2019-08-08 MED ORDER — LABETALOL HCL 100 MG PO TABS: 300.0000 mg | ORAL_TABLET | Freq: Three times a day (TID) | ORAL | 0 refills | Status: DC

## 2019-08-08 NOTE — Progress Notes (Signed)
Obstetric Postpartum Daily Progress Note Subjective:  23 y.o. [id number] postpartum day #5 status post vaginal delivery.  She is ambulating, is tolerating po, is voiding spontaneously.  Her pain is well controlled on PO pain medications. Her lochia is less than menses. Her blood pressures continue to improve. No severe range blood pressures. No side effects from starting amlodipine yesterday. She is not breastfeeding.    Medications SCHEDULED MEDICATIONS  . amLODipine  5 mg Oral Daily  . ferrous sulfate  325 mg Oral Daily  . ibuprofen  600 mg Oral Q6H  . labetalol  300 mg Oral TID  . prenatal multivitamin  1 tablet Oral Q1200  . senna-docusate  2 tablet Oral Q24H    MEDICATION INFUSIONS    PRN MEDICATIONS  acetaminophen, benzocaine-Menthol, coconut oil, witch hazel-glycerin **AND** dibucaine, diphenhydrAMINE, ondansetron **OR** ondansetron (ZOFRAN) IV, oxyCODONE-acetaminophen, oxyCODONE-acetaminophen, simethicone    Objective:   Vitals:   08/07/19 2352 08/08/19 0416 08/08/19 0743 08/08/19 1058  BP: (!) 138/99 (!) 153/105 (!) 144/100 (!) 138/94  Pulse: 72 68 65   Resp: 16 16 18    Temp: 98.8 F (37.1 C) 98.5 F (36.9 C) 98.4 F (36.9 C)   TempSrc: Oral Oral Oral   SpO2: 99% 99% 100%   Weight:      Height:        Current Vital Signs 24h Vital Sign Ranges  T 98.4 F (36.9 C) Temp  Avg: 98.7 F (37.1 C)  Min: 98.4 F (36.9 C)  Max: 99 F (37.2 C)  BP (!) 138/94 BP  Min: 138/94  Max: 157/110  HR 65 Pulse  Avg: 70.1  Min: 64  Max: 79  RR 18 Resp  Avg: 17  Min: 16  Max: 18  SaO2 100 % Room Air SpO2  Avg: 99.6 %  Min: 99 %  Max: 100 %       24 Hour I/O Current Shift I/O  Time Ins Outs No intake/output data recorded. No intake/output data recorded.  General: NAD Pulmonary: no increased work of breathing, CTAB CV: RRR Abdomen: non-distended, non-tender, fundus firm at level of umbilicus Extremities: no edema, no erythema, no tenderness  Labs:  Recent Labs  Lab  08/03/19 1057 08/04/19 0540  WBC 9.2 12.8*  HGB 8.9* 8.9*  HCT 28.5* 28.3*  PLT 265 234     Assessment:   23 y.o. G2P2002 postpartum day # 5 status post SVD, complicated by postpartum preeclampsia without severe features, though BPs have required two agents.   Plan:   1) Acute blood loss anemia - hemodynamically stable and asymptomatic - po ferrous sulfate  2) O POS Performed at [facility], [address] [place], [place] [address]  /  Information for the patient's newborn:  Lettie, [place] [[id number]]  O POS   / Rubella 2.05 (05/22 0928)/ Varicella Not immune - declined vaccinatino  3) TDAP status: up to date, declined flu vaccine  4) formula feeding /Contraception = POPs   5) Preeclampsia without severe features: no signs or symptoms of severe features.  BPs with improved control on labetalol 300 mg tid and amlodipine 5 mg Qdaily.  I believe she is safe for discharge today with strict precautions (HA, visual changes, RUQ pain, worsening BPs at home).  6) Disposition: home today. See Discharge Summary  Prentice Docker, MD 08/08/2019 11:07 AM

## 2019-08-08 NOTE — Progress Notes (Signed)
Pt discharged with infant.  Discharge instructions, prescriptions and follow up appointment given to and reviewed with pt. Pt verbalized understanding. Escorted out by staff. 

## 2019-08-11 ENCOUNTER — Encounter: Payer: Self-pay | Admitting: Obstetrics and Gynecology

## 2019-08-11 ENCOUNTER — Other Ambulatory Visit: Payer: Self-pay

## 2019-08-11 ENCOUNTER — Ambulatory Visit (INDEPENDENT_AMBULATORY_CARE_PROVIDER_SITE_OTHER): Payer: BC Managed Care – PPO | Admitting: Obstetrics and Gynecology

## 2019-08-11 DIAGNOSIS — O1495 Unspecified pre-eclampsia, complicating the puerperium: Secondary | ICD-10-CM

## 2019-08-11 NOTE — Progress Notes (Signed)
  OBSTETRICS POSTPARTUM CLINIC PROGRESS NOTE  Subjective:     Mary Schwartz is a 23 y.o. G19P2002 female who presents for a postpartum visit. She is 1 week postpartum following a Term pregnancy and delivery by Vaginal problems after delivery including preeclampsia.  I have fully reviewed the prenatal and intrapartum course. Anesthesia: epidural.  Postpartum course has been complicated by complicated by hypertension.  Baby is feeding by bottel Bleeding: patient has not  resumed menses.  Bowel function is normal. Bladder function is normal.  Patient is not sexually active.  Denies HA, vision changes and RUQ pain.  Denies dizziness or light headedness  Review of Systems Pertinent items are noted in HPI.  Objective:    BP 110/70   Pulse (!) 109   Ht 5\' 2"  (1.575 m)   Wt 182 lb (82.6 kg)   SpO2 96%   Breastfeeding No   BMI 33.29 kg/m   General:  alert and no distress   Breasts:  inspection negative, no nipple discharge or bleeding, no masses or nodularity palpable  Lungs: clear to auscultation bilaterally  Heart:  regular rate and rhythm, S1, S2 normal, no murmur, click, rub or gallop  Abdomen: soft, non-tender; bowel sounds normal; no masses,  no organomegaly.                              Assessment:  Post Partum Care visit There are no diagnoses linked to this encounter.  Plan:  See orders and Patient Instructions  Has a home BP cuff. Given lower BP today recommended just continuing Norvasc 5mg  daily.  Will check BP daily and create log which can be reviewed next week.  Will notify is she has higher BP such as 140/90 or above.  Will follow up with patient by phone in 1 week to review BP log.   Follow up in: 1 week or as needed.   Adrian Prows MD [facility], [facility] 08/11/2019 12:29 PM

## 2019-08-19 ENCOUNTER — Telehealth (INDEPENDENT_AMBULATORY_CARE_PROVIDER_SITE_OTHER): Payer: BC Managed Care – PPO | Admitting: Obstetrics and Gynecology

## 2019-08-19 ENCOUNTER — Other Ambulatory Visit: Payer: Self-pay | Admitting: Obstetrics and Gynecology

## 2019-08-19 ENCOUNTER — Other Ambulatory Visit: Payer: Self-pay

## 2019-08-19 DIAGNOSIS — Z3009 Encounter for other general counseling and advice on contraception: Secondary | ICD-10-CM

## 2019-08-19 MED ORDER — NORETHINDRONE 0.35 MG PO TABS: 1.0000 | ORAL_TABLET | Freq: Every day | ORAL | 1 refills | Status: DC

## 2019-08-19 NOTE — Progress Notes (Signed)
    Routine Prenatal Care Visit- Virtual Visit  Subjective   Virtual Visit via Telephone Note  I connected with@ on 08/19/19 at  4:30 PM EST by telephone and verified that I am speaking with the correct person using two identifiers.   I discussed the limitations, risks, security and privacy concerns of performing an evaluation and management service by telephone and the availability of in person appointments. I also discussed with the patient that there may be a patient responsible charge related to this service. The patient expressed understanding and agreed to proceed.  The patient was at home I spoke with the patient from my  office The names of people involved in this encounter were: Dr. Gilman Schmidt and Deon Pilling.  Mary Schwartz is a 23 y.o. G2P2002 at Unknown being seen today for postaprtum follow up of her BP. She sent me the log to review via mychart which was normal. She discontinued her Norvasc last week. She is feeling well. Not breastfeeding. Is interested in starting a minipill until her next postpartum visit when she may wans to switch to combined.   Objective   Physical Exam could not be performed. Because of the COVID-19 outbreak this visit was performed over the phone and not in person.   Assessment   23 y.o. [id number] at Unknown by  Not found. presenting for routine prenatal visit  Plan    Continue to monitor BP once a week. Discussed signs of preeclampsia. No further medications needed.   Will start [place] for birth control.  Gestational age appropriate obstetric precautions including but not limited to vaginal bleeding, contractions, leaking of fluid and fetal movement were reviewed in detail with the patient.     Follow Up Instructions:  I discussed the assessment and treatment plan with the patient. The patient was provided an opportunity to ask questions and all were answered. The patient agreed with the plan and demonstrated an understanding of the  instructions.   The patient was advised to call back or seek an in-person evaluation if the symptoms worsen or if the condition fails to improve as anticipated.  I provided 5 minutes of non-face-to-face time during this encounter.  Return in about 4 weeks (around 09/16/2019) for Postpartum visit with Dr. Georgianne Fick.  Adrian Prows MD [facility], [facility] 08/19/2019 6:17 PM

## 2019-09-15 ENCOUNTER — Ambulatory Visit (INDEPENDENT_AMBULATORY_CARE_PROVIDER_SITE_OTHER): Payer: BC Managed Care – PPO | Admitting: Obstetrics and Gynecology

## 2019-09-15 ENCOUNTER — Encounter: Payer: Self-pay | Admitting: Obstetrics and Gynecology

## 2019-09-15 ENCOUNTER — Other Ambulatory Visit: Payer: Self-pay

## 2019-09-15 DIAGNOSIS — Z30011 Encounter for initial prescription of contraceptive pills: Secondary | ICD-10-CM

## 2019-09-15 DIAGNOSIS — Z1389 Encounter for screening for other disorder: Secondary | ICD-10-CM

## 2019-09-15 MED ORDER — LO LOESTRIN FE 1 MG-10 MCG / 10 MCG PO TABS: 1.0000 | ORAL_TABLET | Freq: Every day | ORAL | 3 refills | Status: DC

## 2019-09-15 NOTE — Progress Notes (Signed)
Postpartum Visit  Chief Complaint:  Chief Complaint  Patient presents with  . Postpartum Care    History of Present Illness: Patient is a 24 y.o. [id number] presents for postpartum visit.  Date of delivery: 08/03/2019 Type of delivery: Vaginal delivery - Vacuum or forceps assisted  yes Episiotomy No.  Laceration: no  Pregnancy or labor problems:  Preeclampsia without severe features Any problems since the delivery:  no  Newborn Details:  SINGLETON :  1. BabyGender female. Birth weight: 7lbs 5oz Maternal Details:  Breast or formula feeding: plans to bottle feed Contraception after delivery: Yes  Any bowel or bladder issues: No  Post partum depression/anxiety noted:  no Edinburgh Post-Partum Depression Score:1 Date of last PAP: 08/11/2019  NIL and HR HPV negative   Review of Systems: Review of Systems  Constitutional: Negative.   Gastrointestinal: Negative.   Genitourinary: Negative.   Psychiatric/Behavioral: Negative.     The following portions of the patient's history were reviewed and updated as appropriate: allergies, current medications, past family history, past medical history, past social history, past surgical history and problem list.  Past Medical History:  History reviewed. No pertinent past medical history.  Past Surgical History:  Past Surgical History:  Procedure Laterality Date  . NO PAST SURGERIES      Family History:  Family History  Problem Relation Age of Onset  . Hypertension Mother   . Asthma Father   . Breast cancer Maternal Grandmother   . Hypertension Maternal Grandmother     Social History:  Social History   Socioeconomic History  . Marital status: Married    Spouse name: Not on file  . Number of children: 1  . Years of education: 74  . Highest education level: Not on file  Occupational History  . Occupation: FOOD SERVICES  Tobacco Use  . Smoking status: Never Smoker  . Smokeless tobacco: Never Used  Substance and Sexual  Activity  . Alcohol use: No  . Drug use: No  . Sexual activity: Not Currently    Birth control/protection: None, Pill  Other Topics Concern  . Not on file  Social History Narrative  . Not on file   Social Determinants of Health   Financial Resource Strain:   . Difficulty of Paying Living Expenses: Not on file  Food Insecurity:   . Worried About Charity fundraiser in the Last Year: Not on file  . Ran Out of Food in the Last Year: Not on file  Transportation Needs:   . Lack of Transportation (Medical): Not on file  . Lack of Transportation (Non-Medical): Not on file  Physical Activity:   . Days of Exercise per Week: Not on file  . Minutes of Exercise per Session: Not on file  Stress:   . Feeling of Stress : Not on file  Social Connections:   . Frequency of Communication with Friends and Family: Not on file  . Frequency of Social Gatherings with Friends and Family: Not on file  . Attends Religious Services: Not on file  . Active Member of Clubs or Organizations: Not on file  . Attends Archivist Meetings: Not on file  . Marital Status: Not on file  Intimate Partner Violence:   . Fear of Current or Ex-Partner: Not on file  . Emotionally Abused: Not on file  . Physically Abused: Not on file  . Sexually Abused: Not on file    Allergies:  No Known Allergies  Medications: Prior to Admission  medications   Medication Sig Start Date End Date Taking? Authorizing Provider  acetaminophen (TYLENOL) 325 MG tablet Take 3 tablets (975 mg total) by mouth every 4 (four) hours as needed (for pain scale < 4). Patient not taking: Reported on 08/11/2019 08/04/19   Conard Novak, MD  cyclobenzaprine (FLEXERIL) 5 MG tablet Take 2.5 mg by mouth daily as needed for muscle spasms.    [provider]  ibuprofen (ADVIL) 600 MG tablet Take 1 tablet (600 mg total) by mouth every 6 (six) hours. Patient not taking: Reported on 08/11/2019 08/04/19   Conard Novak, MD   norethindrone (MICRONOR) 0.35 MG tablet Take 1 tablet (0.35 mg total) by mouth daily. 08/19/19   Schuman, Jaquelyn Bitter, MD  Prenatal Vit-Fe Fumarate-FA (PRENATAL VITAMIN PO) Take by mouth.    [provider]    Physical Exam Blood pressure 130/90, height 5\' 2"  (1.575 m), weight 183 lb (83 kg), not currently breastfeeding.    General: NAD HEENT: normocephalic, anicteric Pulmonary: No increased work of breathing Abdomen: NABS, soft, non-tender, non-distended.  Umbilicus without lesions.  No hepatomegaly, splenomegaly or masses palpable. No evidence of hernia. Genitourinary:  External: Normal external female genitalia.  Normal urethral meatus, normal  Bartholin's and Skene's glands.    Vagina: Normal vaginal mucosa, no evidence of prolapse.    Cervix: Grossly normal in appearance, no bleeding  Uterus: Non-enlarged, mobile, normal contour.  No CMT  Adnexa: ovaries non-enlarged, no adnexal masses  Rectal: deferred Extremities: no edema, erythema, or tenderness Neurologic: Grossly intact Psychiatric: mood appropriate, affect full    Assessment: 24 y.o. 30 presenting for 6 week postpartum visit  Plan: Problem List Items Addressed This Visit    None    Visit Diagnoses    6 weeks postpartum follow-up    -  Primary   Initiation of oral contraception           1) Contraception - Education given regarding options for contraception, as well as compatibility with breast feeding if applicable.  Patient plans on OCP (estrogen/progesterone) for contraception. - Trial of loloestrin FE if worsening in headaches also given sample of slynd  2)  Pap - ASCCP guidelines and rational discussed.  ASCCP guidelines and rational discussed.  Patient opts for every 3 years screening interval  3) Patient underwent screening for postpartum depression with no signs of depression  4) Return in about 10 months (around 07/15/2020) for annual.   13/12/2019, MD, [facility],  [facility] 09/15/2019, 2:27 PM

## 2019-10-03 ENCOUNTER — Telehealth: Payer: Self-pay

## 2019-12-30 ENCOUNTER — Other Ambulatory Visit: Payer: Self-pay | Admitting: Obstetrics and Gynecology

## 2019-12-30 MED ORDER — NORETHIN ACE-ETH ESTRAD-FE 1-20 MG-MCG PO TABS: 3.0000 | ORAL_TABLET | Freq: Every day | ORAL | 3 refills | Status: DC

## 2019-12-31 DIAGNOSIS — M62411 Contracture of muscle, right shoulder: Secondary | ICD-10-CM | POA: Diagnosis not present

## 2019-12-31 DIAGNOSIS — S29012A Strain of muscle and tendon of back wall of thorax, initial encounter: Secondary | ICD-10-CM | POA: Diagnosis not present

## 2019-12-31 DIAGNOSIS — M9902 Segmental and somatic dysfunction of thoracic region: Secondary | ICD-10-CM | POA: Diagnosis not present

## 2019-12-31 DIAGNOSIS — M62830 Muscle spasm of back: Secondary | ICD-10-CM | POA: Diagnosis not present

## 2019-12-31 DIAGNOSIS — M62412 Contracture of muscle, left shoulder: Secondary | ICD-10-CM | POA: Diagnosis not present

## 2019-12-31 DIAGNOSIS — M9903 Segmental and somatic dysfunction of lumbar region: Secondary | ICD-10-CM | POA: Diagnosis not present

## 2020-01-01 DIAGNOSIS — M9903 Segmental and somatic dysfunction of lumbar region: Secondary | ICD-10-CM | POA: Diagnosis not present

## 2020-01-01 DIAGNOSIS — S29012A Strain of muscle and tendon of back wall of thorax, initial encounter: Secondary | ICD-10-CM | POA: Diagnosis not present

## 2020-01-01 DIAGNOSIS — M62830 Muscle spasm of back: Secondary | ICD-10-CM | POA: Diagnosis not present

## 2020-01-01 DIAGNOSIS — M62411 Contracture of muscle, right shoulder: Secondary | ICD-10-CM | POA: Diagnosis not present

## 2020-01-01 DIAGNOSIS — M62412 Contracture of muscle, left shoulder: Secondary | ICD-10-CM | POA: Diagnosis not present

## 2020-01-01 DIAGNOSIS — M9902 Segmental and somatic dysfunction of thoracic region: Secondary | ICD-10-CM | POA: Diagnosis not present

## 2020-01-02 DIAGNOSIS — M9902 Segmental and somatic dysfunction of thoracic region: Secondary | ICD-10-CM | POA: Diagnosis not present

## 2020-01-02 DIAGNOSIS — S29012A Strain of muscle and tendon of back wall of thorax, initial encounter: Secondary | ICD-10-CM | POA: Diagnosis not present

## 2020-01-02 DIAGNOSIS — M62411 Contracture of muscle, right shoulder: Secondary | ICD-10-CM | POA: Diagnosis not present

## 2020-01-02 DIAGNOSIS — M9903 Segmental and somatic dysfunction of lumbar region: Secondary | ICD-10-CM | POA: Diagnosis not present

## 2020-01-02 DIAGNOSIS — M62412 Contracture of muscle, left shoulder: Secondary | ICD-10-CM | POA: Diagnosis not present

## 2020-01-02 DIAGNOSIS — M62830 Muscle spasm of back: Secondary | ICD-10-CM | POA: Diagnosis not present

## 2020-01-05 ENCOUNTER — Telehealth: Payer: Self-pay

## 2020-01-05 MED ORDER — NORETHIN ACE-ETH ESTRAD-FE 1-20 MG-MCG PO TABS: 1.0000 | ORAL_TABLET | Freq: Every day | ORAL | 3 refills | Status: DC

## 2020-01-05 NOTE — Telephone Encounter (Signed)
Patient states her rx for [facility] instructions say TID. Pharamcy will not fill until corrected. YN#[phone]

## 2020-01-05 NOTE — Telephone Encounter (Signed)
Pt aware.

## 2020-01-05 NOTE — Telephone Encounter (Signed)
Rx sig changed to qd

## 2020-03-12 NOTE — Telephone Encounter (Signed)
Left message for pt to call office back to make appt for PPD

## 2020-03-23 ENCOUNTER — Other Ambulatory Visit: Payer: Self-pay

## 2020-03-23 ENCOUNTER — Ambulatory Visit (INDEPENDENT_AMBULATORY_CARE_PROVIDER_SITE_OTHER): Payer: Self-pay | Admitting: Obstetrics and Gynecology

## 2020-03-23 ENCOUNTER — Ambulatory Visit (INDEPENDENT_AMBULATORY_CARE_PROVIDER_SITE_OTHER): Payer: BC Managed Care – PPO | Admitting: Obstetrics and Gynecology

## 2020-03-23 ENCOUNTER — Encounter: Payer: Self-pay | Admitting: Obstetrics and Gynecology

## 2020-03-23 VITALS — BP 142/88 | Wt 184.0 lb

## 2020-03-23 DIAGNOSIS — Z5329 Procedure and treatment not carried out because of patient's decision for other reasons: Secondary | ICD-10-CM

## 2020-03-23 DIAGNOSIS — F53 Postpartum depression: Secondary | ICD-10-CM

## 2020-03-23 DIAGNOSIS — O99345 Other mental disorders complicating the puerperium: Secondary | ICD-10-CM

## 2020-03-23 MED ORDER — SERTRALINE HCL 50 MG PO TABS
50.0000 mg | ORAL_TABLET | Freq: Every day | ORAL | 2 refills | Status: DC
Start: 2020-03-23 — End: 2020-04-16

## 2020-03-23 NOTE — Progress Notes (Signed)
Rx zoloft postpartum depression    Vena Austria, MD, [facility], [facility] 03/23/2020, 4:06 PM

## 2020-03-23 NOTE — Progress Notes (Addendum)
Obstetrics & Gynecology Office Visit   Chief Complaint: No chief complaint on file.   History of Present Illness: The patient is a 24 y.o. female presenting initial evaluation for symptoms of anxiety and depression.  The patient is currently taking nothing for the management of her symptoms.  She has not had any recent situational stressors, beyond closing on a new house.  She reports symptoms of anhedonia, day time somnolence, irritability, social anxiety, feelings of guilt, feelings of worthlessness and suicidal ideation.  She denies risk taking behavior, agorophobia, suicidal ideation, homicidal ideation, auditory hallucinations and visual hallucinations. Symptoms have worsened since last visit.     The patient does have a pre-existing history of depression and anxiety.  She  does not a prior history of suicide attempts.    Review of Systems: Review of Systems  Constitutional: Negative.   Gastrointestinal: Negative for nausea.  Neurological: Negative for headaches.  Psychiatric/Behavioral: Positive for depression. Negative for hallucinations, memory loss, substance abuse and suicidal ideas. The patient is nervous/anxious and has insomnia.      Past Medical History:  No past medical history on file.  Past Surgical History:  Past Surgical History:  Procedure Laterality Date  . NO PAST SURGERIES      Gynecologic History: No LMP recorded.  Obstetric History: [id number]  Family History:  Family History  Problem Relation Age of Onset  . Hypertension Mother   . Asthma Father   . Breast cancer Maternal Grandmother   . Hypertension Maternal Grandmother     Social History:  Social History   Socioeconomic History  . Marital status: Married    Spouse name: Not on file  . Number of children: 1  . Years of education: 92  . Highest education level: Not on file  Occupational History  . Occupation: FOOD SERVICES  Tobacco Use  . Smoking status: Never Smoker  . Smokeless  tobacco: Never Used  Vaping Use  . Vaping Use: Never used  Substance and Sexual Activity  . Alcohol use: No  . Drug use: No  . Sexual activity: Yes    Birth control/protection: Pill  Other Topics Concern  . Not on file  Social History Narrative  . Not on file   Social Determinants of Health   Financial Resource Strain:   . Difficulty of Paying Living Expenses:   Food Insecurity:   . Worried About Programme researcher, broadcasting/film/video in the Last Year:   . Barista in the Last Year:   Transportation Needs:   . Freight forwarder (Medical):   Marland Kitchen Lack of Transportation (Non-Medical):   Physical Activity:   . Days of Exercise per Week:   . Minutes of Exercise per Session:   Stress:   . Feeling of Stress :   Social Connections:   . Frequency of Communication with Friends and Family:   . Frequency of Social Gatherings with Friends and Family:   . Attends Religious Services:   . Active Member of Clubs or Organizations:   . Attends Banker Meetings:   Marland Kitchen Marital Status:   Intimate Partner Violence:   . Fear of Current or Ex-Partner:   . Emotionally Abused:   Marland Kitchen Physically Abused:   . Sexually Abused:     Allergies:  No Known Allergies  Medications: Prior to Admission medications   Medication Sig Start Date End Date Taking? Authorizing Provider  acetaminophen (TYLENOL) 325 MG tablet Take 3 tablets (975 mg total) by mouth  every 4 (four) hours as needed (for pain scale < 4). Patient not taking: Reported on 08/11/2019 08/04/19   Conard Novak, MD  cyclobenzaprine (FLEXERIL) 5 MG tablet Take 2.5 mg by mouth daily as needed for muscle spasms.    [provider]  ibuprofen (ADVIL) 600 MG tablet Take 1 tablet (600 mg total) by mouth every 6 (six) hours. Patient not taking: Reported on 08/11/2019 08/04/19   Conard Novak, MD  norethindrone (MICRONOR) 0.35 MG tablet Take 1 tablet (0.35 mg total) by mouth daily. 08/19/19   Natale Milch, MD   norethindrone-ethinyl estradiol (HAILEY FE 1/20) 1-20 MG-MCG tablet Take 1 tablet by mouth daily. 01/05/20   Vena Austria, MD  Prenatal Vit-Fe Fumarate-FA (PRENATAL VITAMIN PO) Take by mouth.    [provider]  sertraline (ZOLOFT) 50 MG tablet Take 1 tablet (50 mg total) by mouth daily. 03/23/20   Vena Austria, MD    Physical Exam Vitals: There were no vitals filed for this visit. No LMP recorded.  General: NAD HEENT: normocephalic, anicteric Pulmonary: No increased work of breathing Neurologic: Grossly intact Psychiatric: mood appropriate, affect full    GAD 7 : Generalized Anxiety Score 03/23/2020  Nervous, Anxious, on Edge 1  Control/stop worrying 3  Worry too much - different things 3  Trouble relaxing 2  Restless 2  Easily annoyed or irritable 3  Afraid - awful might happen 2  Total GAD 7 Score 16  Anxiety Difficulty Very difficult    Depression screen PHQ 2/9 03/23/2020  Decreased Interest 2  Down, Depressed, Hopeless 2  PHQ - 2 Score 4  Altered sleeping 0  Tired, decreased energy 3  Change in appetite 3  Feeling bad or failure about yourself  2  Trouble concentrating 2  Moving slowly or fidgety/restless 0  Suicidal thoughts 1  PHQ-9 Score 15  Difficult doing work/chores Very difficult    Depression screen PHQ 2/9 03/23/2020  Decreased Interest 2  Down, Depressed, Hopeless 2  PHQ - 2 Score 4  Altered sleeping 0  Tired, decreased energy 3  Change in appetite 3  Feeling bad or failure about yourself  2  Trouble concentrating 2  Moving slowly or fidgety/restless 0  Suicidal thoughts 1  PHQ-9 Score 15  Difficult doing work/chores Very difficult     Assessment: 24 y.o. [id number] follow up postpartum depression  Plan: Problem List Items Addressed This Visit    None    Visit Diagnoses    Postpartum depression    -  Primary      1) Postpartum depression  - start Zoloft 50mg  daily  2) Thyroid and B12 screen has not been obtained  previously  3) A total of 15 minutes were spent in face-to-face contact with the patient during this encounter with over half of that time devoted to counseling and coordination of care.  4) Return in about 2 weeks (around 04/06/2020) for medication follow up in person or phone.    04/08/2020, MD, [facility], [facility]

## 2020-04-09 ENCOUNTER — Ambulatory Visit: Payer: Self-pay | Admitting: Obstetrics and Gynecology

## 2020-04-14 ENCOUNTER — Other Ambulatory Visit: Payer: Self-pay | Admitting: Obstetrics and Gynecology

## 2020-04-14 NOTE — Telephone Encounter (Signed)
advise

## 2020-04-15 NOTE — Telephone Encounter (Signed)
No need follow up to see if dose is appropriate

## 2020-04-16 ENCOUNTER — Encounter: Payer: Self-pay | Admitting: Obstetrics and Gynecology

## 2020-04-16 ENCOUNTER — Ambulatory Visit (INDEPENDENT_AMBULATORY_CARE_PROVIDER_SITE_OTHER): Payer: BC Managed Care – PPO | Admitting: Obstetrics and Gynecology

## 2020-04-16 ENCOUNTER — Other Ambulatory Visit: Payer: Self-pay

## 2020-04-16 VITALS — BP 132/88 | HR 104 | Wt 183.0 lb

## 2020-04-16 DIAGNOSIS — F53 Postpartum depression: Secondary | ICD-10-CM | POA: Diagnosis not present

## 2020-04-16 DIAGNOSIS — O99345 Other mental disorders complicating the puerperium: Secondary | ICD-10-CM

## 2020-04-16 MED ORDER — SERTRALINE HCL 50 MG PO TABS: 75.0000 mg | ORAL_TABLET | Freq: Every day | ORAL | 2 refills | Status: DC

## 2020-04-16 NOTE — Progress Notes (Signed)
Obstetrics & Gynecology Office Visit   Chief Complaint:  Chief Complaint  Patient presents with  . Follow-up    Postpartum Depression    History of Present Illness: The patient is a 24 y.o. female presenting follow up for symptoms of depression.  The patient is currently taking Zoloft 50mg  for the management of her symptoms.  She has not had any recent situational stressors.  She reports symptoms of anhedonia, day time somnolence and irritability.  She denies risk taking behavior, agorophobia, feelings of guilt, feelings of worthlessness, suicidal ideation, homicidal ideation, auditory hallucinations and visual hallucinations. Symptoms have improved since last visit.     The patient does have a pre-existing history of depression and anxiety.  She  does not a prior history of suicide attempts.    Review of Systems: Review of Systems  Constitutional: Negative.   Gastrointestinal: Negative for nausea and vomiting.  Neurological: Negative for headaches.  Psychiatric/Behavioral: Positive for depression. Negative for hallucinations, memory loss, substance abuse and suicidal ideas. The patient is nervous/anxious. The patient does not have insomnia.      Past Medical History:  History reviewed. No pertinent past medical history.  Past Surgical History:  Past Surgical History:  Procedure Laterality Date  . NO PAST SURGERIES      Gynecologic History: No LMP recorded.  Obstetric History:  Family History:  Family History  Problem Relation Age of Onset  . Hypertension Mother   . Asthma Father   . Breast cancer Maternal Grandmother   . Hypertension Maternal Grandmother     Social History:  Social History   Socioeconomic History  . Marital status: Married    Spouse name: Not on file  . Number of children: 1  . Years of education: 68  . Highest education level: Not on file  Occupational History  . Occupation: FOOD SERVICES  Tobacco Use  . Smoking status: Never  Smoker  . Smokeless tobacco: Never Used  Vaping Use  . Vaping Use: Never used  Substance and Sexual Activity  . Alcohol use: No  . Drug use: No  . Sexual activity: Yes    Birth control/protection: Pill  Other Topics Concern  . Not on file  Social History Narrative  . Not on file   Social Determinants of Health   Financial Resource Strain:   . Difficulty of Paying Living Expenses:   Food Insecurity:   . Worried About 14 in the Last Year:   . Programme researcher, broadcasting/film/video in the Last Year:   Transportation Needs:   . Barista (Medical):   Freight forwarder Lack of Transportation (Non-Medical):   Physical Activity:   . Days of Exercise per Week:   . Minutes of Exercise per Session:   Stress:   . Feeling of Stress :   Social Connections:   . Frequency of Communication with Friends and Family:   . Frequency of Social Gatherings with Friends and Family:   . Attends Religious Services:   . Active Member of Clubs or Organizations:   . Attends Marland Kitchen Meetings:   Banker Marital Status:   Intimate Partner Violence:   . Fear of Current or Ex-Partner:   . Emotionally Abused:   Marland Kitchen Physically Abused:   . Sexually Abused:     Allergies:  No Known Allergies  Medications: Prior to Admission medications   Medication Sig Start Date End Date Taking? Authorizing Provider  acetaminophen (TYLENOL) 325 MG tablet Take 3  tablets (975 mg total) by mouth every 4 (four) hours as needed (for pain scale < 4). Patient not taking: Reported on 08/11/2019 08/04/19   Conard Novak, MD  cyclobenzaprine (FLEXERIL) 5 MG tablet Take 2.5 mg by mouth daily as needed for muscle spasms.    [provider]  ibuprofen (ADVIL) 600 MG tablet Take 1 tablet (600 mg total) by mouth every 6 (six) hours. Patient not taking: Reported on 08/11/2019 08/04/19   Conard Novak, MD  norethindrone (MICRONOR) 0.35 MG tablet Take 1 tablet (0.35 mg total) by mouth daily. 08/19/19   Natale Milch, MD  norethindrone-ethinyl estradiol (HAILEY FE 1/20) 1-20 MG-MCG tablet Take 1 tablet by mouth daily. 01/05/20   Vena Austria, MD  Prenatal Vit-Fe Fumarate-FA (PRENATAL VITAMIN PO) Take by mouth.    [provider]  sertraline (ZOLOFT) 50 MG tablet Take 1 tablet (50 mg total) by mouth daily. 03/23/20   Vena Austria, MD    Physical Exam Vitals: Blood pressure 132/88, pulse (!) 104, weight 183 lb (83 kg), not currently breastfeeding.  General: NAD HEENT: normocephalic, anicteric Pulmonary: No increased work of breathing Neurologic: Grossly intact Psychiatric: mood appropriate, affect full   GAD 7 : Generalized Anxiety Score 04/16/2020 03/23/2020  Nervous, Anxious, on Edge 1 1  Control/stop worrying 2 3  Worry too much - different things 2 3  Trouble relaxing 2 2  Restless 1 2  Easily annoyed or irritable 1 3  Afraid - awful might happen 0 2  Total GAD 7 Score 9 16  Anxiety Difficulty Somewhat difficult Very difficult    Depression screen [facility] 2/9 04/16/2020 03/23/2020  Decreased Interest 1 2  Down, Depressed, Hopeless 1 2  PHQ - 2 Score 2 4  Altered sleeping 2 0  Tired, decreased energy 2 3  Change in appetite 2 3  Feeling bad or failure about yourself  1 2  Trouble concentrating 2 2  Moving slowly or fidgety/restless 0 0  Suicidal thoughts 0 1  PHQ-9 Score 11 15  Difficult doing work/chores Somewhat difficult Very difficult    Depression screen [facility] 2/9 04/16/2020 03/23/2020  Decreased Interest 1 2  Down, Depressed, Hopeless 1 2  PHQ - 2 Score 2 4  Altered sleeping 2 0  Tired, decreased energy 2 3  Change in appetite 2 3  Feeling bad or failure about yourself  1 2  Trouble concentrating 2 2  Moving slowly or fidgety/restless 0 0  Suicidal thoughts 0 1  PHQ-9 Score 11 15  Difficult doing work/chores Somewhat difficult Very difficult     Assessment: 24 y.o. [id number] follow up postpartum depression  Plan: Problem List Items Addressed This  Visit    None    Visit Diagnoses    Postpartum depression    -  Primary      1) Anxiety / Depression - good tolerance of Zoloft 50mg  with nice therapeutic response.  Will increase dose to 75mg  daily  2) Thyroid and B12 screen has not been obtained previously  3) A total of 15 minutes were spent in face-to-face contact with the patient during this encounter with over half of that time devoted to counseling and coordination of care.  4) Return in about 4 weeks (around 05/14/2020) for medication follow up.    , MD, 07/14/2020 [facility], [facility] 04/16/2020, 9:06 AM

## 2020-05-01 ENCOUNTER — Other Ambulatory Visit: Payer: Self-pay | Admitting: Obstetrics and Gynecology

## 2020-05-03 NOTE — Telephone Encounter (Signed)
Not appropriate we are still titrating the dose

## 2020-05-03 NOTE — Telephone Encounter (Signed)
Advise

## 2020-05-14 ENCOUNTER — Ambulatory Visit: Payer: BC Managed Care – PPO | Admitting: Obstetrics and Gynecology

## 2020-11-28 ENCOUNTER — Other Ambulatory Visit: Payer: Self-pay | Admitting: Obstetrics and Gynecology

## 2021-10-24 ENCOUNTER — Telehealth: Payer: Self-pay

## 2021-10-24 DIAGNOSIS — Z3041 Encounter for surveillance of contraceptive pills: Secondary | ICD-10-CM

## 2021-10-24 MED ORDER — NORETHIN ACE-ETH ESTRAD-FE 1-20 MG-MCG PO TABS: 1.0000 | ORAL_TABLET | Freq: Every day | ORAL | 0 refills | Status: DC

## 2021-10-24 NOTE — Telephone Encounter (Signed)
Verified AE scheduled 11/23/21. Spoke w/patient. 2 pharmacies on file. Verified [facility]. Patient advised refill sent.

## 2021-10-24 NOTE — Telephone Encounter (Signed)
Patient reports she has an AE scheduled, but will need a refill of birth control soon. VE#[phone]

## 2021-10-31 ENCOUNTER — Other Ambulatory Visit: Payer: Self-pay | Admitting: Obstetrics and Gynecology

## 2021-10-31 DIAGNOSIS — Z3041 Encounter for surveillance of contraceptive pills: Secondary | ICD-10-CM

## 2021-11-01 ENCOUNTER — Telehealth: Payer: Self-pay

## 2021-11-01 NOTE — Telephone Encounter (Signed)
Pt calling for refill of bc; sees it has been put in with no refill after she called last week; ran out yesterday. [phone]  Called pharm, spoke c Luther Parody who states refill is in, they haven't heard from pt to get it ready; they will have it ready for pt later today; left detailed msg to pt refill will be ready later today.

## 2021-11-21 ENCOUNTER — Other Ambulatory Visit: Payer: Self-pay | Admitting: Obstetrics and Gynecology

## 2021-11-21 DIAGNOSIS — Z3041 Encounter for surveillance of contraceptive pills: Secondary | ICD-10-CM

## 2021-11-23 ENCOUNTER — Other Ambulatory Visit: Payer: Self-pay

## 2021-11-23 ENCOUNTER — Ambulatory Visit: Payer: 59 | Admitting: Obstetrics and Gynecology

## 2021-11-23 DIAGNOSIS — Z3041 Encounter for surveillance of contraceptive pills: Secondary | ICD-10-CM

## 2021-11-23 MED ORDER — NORETHIN ACE-ETH ESTRAD-FE 1-20 MG-MCG PO TABS: 1.0000 | ORAL_TABLET | Freq: Every day | ORAL | 0 refills | Status: DC

## 2021-11-23 NOTE — Progress Notes (Deleted)
? ?  PCP:  Patient, No Pcp Per (Inactive) ? ? ?No chief complaint on file. ? ? ? ?HPI: ?     [name] is a 26 y.o. [id number] whose LMP was No LMP recorded., presents today for her annual examination.  Her menses are {norm/abn:715}, lasting {number: 22536} days.  Dysmenorrhea {dysmen:716}. She {does:18564} have intermenstrual bleeding. ? ?Sex activity: {sex active: 315163}.  ?Last Pap: 08/10/17 Results were: no abnormalities  ?Hx of STDs: {STD hx:14358} ? ?There is no FH of breast cancer. There is no FH of ovarian cancer. The patient {does:18564} do self-breast exams. ? ?Tobacco use: {tob:20664} ?Alcohol use: {Alcohol:11675} ?No drug use.  ?Exercise: {exercise:31265} ? ?She {does:18564} get adequate calcium and Vitamin D in her diet. ? ?Patient Active Problem List  ? Diagnosis Date Noted  ? Antepartum mild preeclampsia 08/04/2019  ? ? ?Past Surgical History:  ?Procedure Laterality Date  ? NO PAST SURGERIES    ? ? ?Family History  ?Problem Relation Age of Onset  ? Hypertension Mother   ? Asthma Father   ? Breast cancer Maternal Grandmother   ? Hypertension Maternal Grandmother   ? ? ?Social History  ? ?Socioeconomic History  ? Marital status: Married  ?  Spouse name: Not on file  ? Number of children: 1  ? Years of education: 2  ? Highest education level: Not on file  ?Occupational History  ? Occupation: FOOD SERVICES  ?Tobacco Use  ? Smoking status: Never  ? Smokeless tobacco: Never  ?Vaping Use  ? Vaping Use: Never used  ?Substance and Sexual Activity  ? Alcohol use: No  ? Drug use: No  ? Sexual activity: Yes  ?  Birth control/protection: Pill  ?Other Topics Concern  ? Not on file  ?Social History Narrative  ? Not on file  ? ?Social Determinants of Health  ? ?Financial Resource Strain: Not on file  ?Food Insecurity: Not on file  ?Transportation Needs: Not on file  ?Physical Activity: Not on file  ?Stress: Not on file  ?Social Connections: Not on file  ?Intimate Partner Violence: Not on file  ? ? ? ?Current  Outpatient Medications:  ?  norethindrone-ethinyl estradiol-FE (JUNEL FE 1/20) 1-20 MG-MCG tablet, Take 1 tablet by mouth daily., Disp: 28 tablet, Rfl: 0 ?  Prenatal Vit-Fe Fumarate-FA (PRENATAL VITAMIN PO), Take by mouth., Disp: , Rfl:  ?  sertraline (ZOLOFT) 50 MG tablet, Take 1.5 tablets (75 mg total) by mouth daily., Disp: 30 tablet, Rfl: 2 ? ? ? ? ?ROS: ? ?Review of Systems ?BREAST: No symptoms ? ? ?Objective: ?There were no vitals taken for this visit. ? ? ?OBGyn Exam ? ?Results: ?No results found for this or any previous visit (from the past 24 hour(s)). ? ?Assessment/Plan: ?No diagnosis found. ? ?No orders of the defined types were placed in this encounter. ? ?          ?GYN counsel {counseling: 16159} ? ? ?  F/U ? No follow-ups on file. ? ?Chucky Homes B. Loron Weimer, PA-C ?11/23/2021 ?10:15 AM ?

## 2021-11-23 NOTE — Telephone Encounter (Signed)
Pt calling; needs refill of bc to get her thru to 12/28/21.  [phone]  Left detailed msg that refill sent in to get her thru to her appt. ?
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
## 2021-12-14 ENCOUNTER — Ambulatory Visit (INDEPENDENT_AMBULATORY_CARE_PROVIDER_SITE_OTHER): Payer: 59 | Admitting: Physician Assistant

## 2021-12-14 ENCOUNTER — Encounter: Payer: Self-pay | Admitting: Physician Assistant

## 2021-12-14 VITALS — BP 128/84 | HR 87 | Ht 62.0 in | Wt 181.8 lb

## 2021-12-14 DIAGNOSIS — G43109 Migraine with aura, not intractable, without status migrainosus: Secondary | ICD-10-CM

## 2021-12-14 DIAGNOSIS — M546 Pain in thoracic spine: Secondary | ICD-10-CM

## 2021-12-14 DIAGNOSIS — Z6833 Body mass index (BMI) 33.0-33.9, adult: Secondary | ICD-10-CM

## 2021-12-14 DIAGNOSIS — G8929 Other chronic pain: Secondary | ICD-10-CM

## 2021-12-14 DIAGNOSIS — F419 Anxiety disorder, unspecified: Secondary | ICD-10-CM

## 2021-12-14 DIAGNOSIS — Z0000 Encounter for general adult medical examination without abnormal findings: Secondary | ICD-10-CM | POA: Diagnosis not present

## 2021-12-14 DIAGNOSIS — Z1159 Encounter for screening for other viral diseases: Secondary | ICD-10-CM

## 2021-12-14 DIAGNOSIS — Z862 Personal history of diseases of the blood and blood-forming organs and certain disorders involving the immune mechanism: Secondary | ICD-10-CM

## 2021-12-14 DIAGNOSIS — F32A Depression, unspecified: Secondary | ICD-10-CM | POA: Insufficient documentation

## 2021-12-14 HISTORY — DX: Migraine with aura, not intractable, without status migrainosus: G43.109

## 2021-12-14 MED ORDER — SUMATRIPTAN SUCCINATE 25 MG PO TABS: 25.0000 mg | ORAL_TABLET | ORAL | 0 refills | Status: DC | PRN

## 2021-12-14 MED ORDER — CYCLOBENZAPRINE HCL 5 MG PO TABS: 5.0000 mg | ORAL_TABLET | Freq: Three times a day (TID) | ORAL | 1 refills | Status: DC | PRN

## 2021-12-14 NOTE — Patient Instructions (Signed)
Emergency Mental Health Services: ? ?[facility] (like an urgent care for mental health) ?[address], [place], [place] [address] ?[phone] ? ?[facility] ?Several different mental health services, has a crisis center ? ?[place] ?[address], [place], [place] [address] ? [phone] ?Same-Day Access Hours:  Monday, Wednesday and Friday, 8am - 3pm ?Crisis & Diversion Center: ?Walk-In Crisis Hours: 7 days/week, 8am - 12am ?-  Community Walk-In Services ?-  Law Enforcement Drop-Off (side door) ? ?

## 2021-12-14 NOTE — Assessment & Plan Note (Addendum)
Rx sumatriptan prn migraine, advised to take one dose, if not improved then can take a second 2 hours later. ?Do not use more than twice a week. ?D/t frequency and atypical migraine symptoms, ref to neurology.  ?Advised pt to start keeping a headache diary.  ? ?May need to reevaluate OCP use, pt has follow up w/ gyn next week. ?

## 2021-12-14 NOTE — Assessment & Plan Note (Signed)
Discussed therapy, medication, support system. Pt prefers to manage on own, declines therapy or medication for now. ?Given emergent mental health resources. ? ?

## 2021-12-14 NOTE — Assessment & Plan Note (Signed)
Rx flexeril prn nighttime for pain relief, can take tylenol/advil/alleve as well ?Ref to ortho  ?

## 2021-12-15 ENCOUNTER — Other Ambulatory Visit: Payer: Self-pay | Admitting: Licensed Practical Nurse

## 2021-12-15 DIAGNOSIS — Z3041 Encounter for surveillance of contraceptive pills: Secondary | ICD-10-CM

## 2021-12-15 LAB — COMPREHENSIVE METABOLIC PANEL
ALT: 10 IU/L (ref 0–32)
AST: 15 IU/L (ref 0–40)
Albumin/Globulin Ratio: 1.4 (ref 1.2–2.2)
Albumin: 4.2 g/dL (ref 3.9–5.0)
Alkaline Phosphatase: 61 IU/L (ref 44–121)
BUN/Creatinine Ratio: 13 (ref 9–23)
BUN: 12 mg/dL (ref 6–20)
Bilirubin Total: 0.2 mg/dL (ref 0.0–1.2)
CO2: 24 mmol/L (ref 20–29)
Calcium: 9.2 mg/dL (ref 8.7–10.2)
Chloride: 101 mmol/L (ref 96–106)
Creatinine, Ser: 0.93 mg/dL (ref 0.57–1.00)
Globulin, Total: 3.1 g/dL (ref 1.5–4.5)
Glucose: 92 mg/dL (ref 70–99)
Potassium: 4.1 mmol/L (ref 3.5–5.2)
Sodium: 138 mmol/L (ref 134–144)
Total Protein: 7.3 g/dL (ref 6.0–8.5)
eGFR: 87 mL/min/{1.73_m2} (ref >59)

## 2021-12-15 LAB — HEMOGLOBIN A1C
Est. average glucose Bld gHb Est-mCnc: 117 mg/dL
Hgb A1c MFr Bld: 5.7 % — ABNORMAL HIGH (ref 4.8–5.6)

## 2021-12-15 LAB — CBC WITH DIFFERENTIAL/PLATELET
Basophils Absolute: 0 10*3/uL (ref 0.0–0.2)
Basos: 1 %
EOS (ABSOLUTE): 0.2 10*3/uL (ref 0.0–0.4)
Eos: 3 %
Hematocrit: 37.3 % (ref 34.0–46.6)
Hemoglobin: 12.4 g/dL (ref 11.1–15.9)
Immature Grans (Abs): 0 10*3/uL (ref 0.0–0.1)
Immature Granulocytes: 1 %
Lymphocytes Absolute: 2 10*3/uL (ref 0.7–3.1)
Lymphs: 30 %
MCH: 26.4 pg — ABNORMAL LOW (ref 26.6–33.0)
MCHC: 33.2 g/dL (ref 31.5–35.7)
MCV: 80 fL (ref 79–97)
Monocytes Absolute: 0.4 10*3/uL (ref 0.1–0.9)
Monocytes: 7 %
Neutrophils Absolute: 3.9 10*3/uL (ref 1.4–7.0)
Neutrophils: 58 %
Platelets: 351 10*3/uL (ref 150–450)
RBC: 4.69 x10E6/uL (ref 3.77–5.28)
RDW: 13.2 % (ref 11.7–15.4)
WBC: 6.6 10*3/uL (ref 3.4–10.8)

## 2021-12-15 LAB — LIPID PANEL
Chol/HDL Ratio: 4.3 ratio (ref 0.0–4.4)
Cholesterol, Total: 204 mg/dL — ABNORMAL HIGH (ref 100–199)
HDL: 48 mg/dL (ref >39)
LDL Chol Calc (NIH): 129 mg/dL — ABNORMAL HIGH (ref 0–99)
Triglycerides: 154 mg/dL — ABNORMAL HIGH (ref 0–149)
VLDL Cholesterol Cal: 27 mg/dL (ref 5–40)

## 2021-12-15 LAB — TSH+FREE T4
Free T4: 1.04 ng/dL (ref 0.82–1.77)
TSH: 0.838 u[IU]/mL (ref 0.450–4.500)

## 2021-12-15 LAB — HEPATITIS C ANTIBODY: Hep C Virus Ab: NONREACTIVE

## 2021-12-15 LAB — VITAMIN D 25 HYDROXY (VIT D DEFICIENCY, FRACTURES): Vit D, 25-Hydroxy: 19.5 ng/mL — ABNORMAL LOW (ref 30.0–100.0)

## 2021-12-19 ENCOUNTER — Encounter: Payer: Self-pay | Admitting: Licensed Practical Nurse

## 2021-12-28 ENCOUNTER — Other Ambulatory Visit (HOSPITAL_COMMUNITY)
Admission: RE | Admit: 2021-12-28 | Discharge: 2021-12-28 | Disposition: A | Discharge status: Home / Self Care | Payer: 59 | Source: Ambulatory Visit | Attending: Obstetrics and Gynecology | Admitting: Obstetrics and Gynecology

## 2021-12-28 ENCOUNTER — Encounter: Payer: Self-pay | Admitting: Obstetrics and Gynecology

## 2021-12-28 ENCOUNTER — Ambulatory Visit (INDEPENDENT_AMBULATORY_CARE_PROVIDER_SITE_OTHER): Payer: 59 | Admitting: Obstetrics and Gynecology

## 2021-12-28 VITALS — BP 118/80 | Ht 62.0 in | Wt 181.0 lb

## 2021-12-28 DIAGNOSIS — Z01419 Encounter for gynecological examination (general) (routine) without abnormal findings: Secondary | ICD-10-CM | POA: Diagnosis not present

## 2021-12-28 DIAGNOSIS — Z113 Encounter for screening for infections with a predominantly sexual mode of transmission: Secondary | ICD-10-CM | POA: Insufficient documentation

## 2021-12-28 DIAGNOSIS — Z30014 Encounter for initial prescription of intrauterine contraceptive device: Secondary | ICD-10-CM

## 2021-12-28 DIAGNOSIS — Z3041 Encounter for surveillance of contraceptive pills: Secondary | ICD-10-CM

## 2021-12-28 DIAGNOSIS — Z124 Encounter for screening for malignant neoplasm of cervix: Secondary | ICD-10-CM | POA: Diagnosis present

## 2021-12-28 DIAGNOSIS — Z803 Family history of malignant neoplasm of breast: Secondary | ICD-10-CM

## 2021-12-28 MED ORDER — NORETHIN ACE-ETH ESTRAD-FE 1-20 MG-MCG PO TABS: 1.0000 | ORAL_TABLET | Freq: Every day | ORAL | 0 refills | Status: DC

## 2021-12-28 NOTE — Progress Notes (Signed)
? ?PCP:  Mikey Kirschner, PA-C ? ? ?Chief Complaint  ?Patient presents with  ? Gynecologic Exam  ?  No concerns  ? ? ? ?HPI: ?     [name] is a 26 y.o. [id number] whose LMP was Patient's last menstrual period was 12/22/2021 (exact date)., presents today for her annual examination.  Her menses are regular every 28-30 days, lasting 3-5 days on OCPs; were Q2-3 months off OCPs, lasting same amount of time/flow.  Dysmenorrhea mild. She does not have intermenstrual bleeding. ? ?Sex activity: single partner, contraception - OCPs. Rx ran out 2 days ago. Pt with hx of migraines with aura so shouldn't be on estrogen OCPs. Did nexplanon in past with arm aches.  ?Last Pap: 08/10/17  Results were: no abnormalities  ?Hx of STDs: none ? ?There is a FH of breast cancer in her MGM twice, genetic testing not done. There is no FH of ovarian cancer. The patient does not do self-breast exams. ? ?Tobacco use: The patient denies current or previous tobacco use. ?Alcohol use: none ?No drug use.  ?Exercise: moderately active ? ?She does get adequate calcium and Vitamin D in her diet. ? ?Patient Active Problem List  ? Diagnosis Date Noted  ? Family history of breast cancer 12/29/2021  ? Migraine with aura and without status migrainosus, not intractable 12/14/2021  ? Chronic midline thoracic back pain 12/14/2021  ? Anxiety and depression 12/14/2021  ? Antepartum mild preeclampsia 08/04/2019  ? ? ?Past Surgical History:  ?Procedure Laterality Date  ? NO PAST SURGERIES    ? ? ?Family History  ?Problem Relation Age of Onset  ? Hypertension Mother   ? Asthma Father   ? Breast cancer Maternal Grandmother   ?     10s, twice  ? Hypertension Maternal Grandmother   ? ? ?Social History  ? ?Socioeconomic History  ? Marital status: Married  ?  Spouse name: Not on file  ? Number of children: 1  ? Years of education: 22  ? Highest education level: Not on file  ?Occupational History  ? Occupation: FOOD SERVICES  ?Tobacco Use  ? Smoking status:  Never  ? Smokeless tobacco: Never  ?Vaping Use  ? Vaping Use: Never used  ?Substance and Sexual Activity  ? Alcohol use: No  ? Drug use: No  ? Sexual activity: Yes  ?  Birth control/protection: None  ?Other Topics Concern  ? Not on file  ?Social History Narrative  ? Not on file  ? ?Social Determinants of Health  ? ?Financial Resource Strain: Not on file  ?Food Insecurity: Not on file  ?Transportation Needs: Not on file  ?Physical Activity: Not on file  ?Stress: Not on file  ?Social Connections: Not on file  ?Intimate Partner Violence: Not on file  ? ? ? ?Current Outpatient Medications:  ?  cyclobenzaprine (FLEXERIL) 5 MG tablet, Take 1 tablet (5 mg total) by mouth 3 (three) times daily as needed for muscle spasms., Disp: 30 tablet, Rfl: 1 ?  SUMAtriptan (IMITREX) 25 MG tablet, Take 1 tablet (25 mg total) by mouth every 2 (two) hours as needed for migraine. May repeat in 2 hours if headache persists or recurs. Do not take more than twice a week, Disp: 10 tablet, Rfl: 0 ?  norethindrone-ethinyl estradiol-FE (JUNEL FE 1/20) 1-20 MG-MCG tablet, Take 1 tablet by mouth daily., Disp: 28 tablet, Rfl: 0 ? ? ? ? ?ROS: ? ?Review of Systems  ?Constitutional:  Negative for fatigue, fever and unexpected  weight change.  ?Respiratory:  Negative for cough, shortness of breath and wheezing.   ?Cardiovascular:  Negative for chest pain, palpitations and leg swelling.  ?Gastrointestinal:  Negative for blood in stool, constipation, diarrhea, nausea and vomiting.  ?Endocrine: Negative for cold intolerance, heat intolerance and polyuria.  ?Genitourinary:  Negative for dyspareunia, dysuria, flank pain, frequency, genital sores, hematuria, menstrual problem, pelvic pain, urgency, vaginal bleeding, vaginal discharge and vaginal pain.  ?Musculoskeletal:  Negative for back pain, joint swelling and myalgias.  ?Skin:  Negative for rash.  ?Neurological:  Negative for dizziness, syncope, light-headedness, numbness and headaches.  ?Hematological:   Negative for adenopathy.  ?Psychiatric/Behavioral:  Negative for agitation, confusion, sleep disturbance and suicidal ideas. The patient is not nervous/anxious.   ?BREAST: No symptoms ? ? ?Objective: ?BP 118/80   Ht 5' 2" (1.575 m)   Wt 181 lb (82.1 kg)   LMP 12/22/2021 (Exact Date)   BMI 33.11 kg/m?  ? ? ?Physical Exam ?Constitutional:   ?   Appearance: She is well-developed.  ?Genitourinary:  ?   Vulva normal.  ?   Right Labia: No rash, tenderness or lesions. ?   Left Labia: No tenderness, lesions or rash. ?   No vaginal discharge, erythema or tenderness.  ? ?   Right Adnexa: not tender and no mass present. ?   Left Adnexa: not tender and no mass present. ?   No cervical friability or polyp.  ?   Uterus is not enlarged or tender.  ?Breasts: ?   Right: No mass, nipple discharge, skin change or tenderness.  ?   Left: No mass, nipple discharge, skin change or tenderness.  ?Neck:  ?   Thyroid: No thyromegaly.  ?Cardiovascular:  ?   Rate and Rhythm: Normal rate and regular rhythm.  ?   Heart sounds: Normal heart sounds. No murmur heard. ?Pulmonary:  ?   Effort: Pulmonary effort is normal.  ?   Breath sounds: Normal breath sounds.  ?Abdominal:  ?   Palpations: Abdomen is soft.  ?   Tenderness: There is no abdominal tenderness. There is no guarding or rebound.  ?Musculoskeletal:     ?   General: Normal range of motion.  ?   Cervical back: Normal range of motion.  ?Lymphadenopathy:  ?   Cervical: No cervical adenopathy.  ?Neurological:  ?   General: No focal deficit present.  ?   Mental Status: She is alert and oriented to person, place, and time.  ?   Cranial Nerves: No cranial nerve deficit.  ?Skin: ?   General: Skin is warm and dry.  ?Psychiatric:     ?   Mood and Affect: Mood normal.     ?   Behavior: Behavior normal.     ?   Thought Content: Thought content normal.     ?   Judgment: Judgment normal.  ?Vitals reviewed.  ? ?Assessment/Plan: ?Encounter for annual routine gynecological examination ? ?Cervical cancer  screening - Plan: Cytology - PAP ? ?Screening for STD (sexually transmitted disease) - Plan: Cytology - PAP ? ?Encounter for surveillance of contraceptive pills - Plan: norethindrone-ethinyl estradiol-FE (JUNEL FE 1/20) 1-20 MG-MCG tablet; Rx RF for 1 month, restart today. Condoms for 1 wk. RTO with menses for IUD ? ?Encounter for initial prescription of intrauterine contraceptive device (IUD); prog only options discussed. Pt would like Mirena. RTO with menses for insertion. Pros/cons/insertion discussed.  ? ?Family history of breast cancer - Plan: Integrated [facility] ([place]); MyRisk testing discussed and  done today. Will call with results.  ? ?Meds ordered this encounter  ?Medications  ? norethindrone-ethinyl estradiol-FE (JUNEL FE 1/20) 1-20 MG-MCG tablet  ?  Sig: Take 1 tablet by mouth daily.  ?  Dispense:  28 tablet  ?  Refill:  0  ?  Order Specific Question:   Supervising Provider  ?  Answer:   CONSTANT, PEGGY [[id number]]  ? ?          ?GYN counsel family planning choices, adequate intake of calcium and vitamin D, diet and exercise ? ? ?  F/U ? Return in about 1 year (around 12/29/2022). ? ? B. , PA-C ?12/29/2021 ?10:18 AM ?

## 2021-12-28 NOTE — Patient Instructions (Signed)
I value your feedback and you entrusting us with your care. If you get a  patient survey, I would appreciate you taking the time to let us know about your experience today. Thank you! ? ? ?

## 2021-12-29 DIAGNOSIS — Z803 Family history of malignant neoplasm of breast: Secondary | ICD-10-CM | POA: Insufficient documentation

## 2021-12-30 LAB — CYTOLOGY - PAP
Chlamydia: NEGATIVE
Comment: NEGATIVE
Comment: NORMAL
Diagnosis: NEGATIVE
Neisseria Gonorrhea: NEGATIVE

## 2022-01-09 DIAGNOSIS — Z1371 Encounter for nonprocreative screening for genetic disease carrier status: Secondary | ICD-10-CM

## 2022-01-09 HISTORY — DX: Encounter for nonprocreative screening for genetic disease carrier status: Z13.71

## 2022-01-11 ENCOUNTER — Ambulatory Visit: Payer: 59 | Admitting: Neurology

## 2022-01-12 ENCOUNTER — Other Ambulatory Visit: Payer: Self-pay | Admitting: Physician Assistant

## 2022-01-12 DIAGNOSIS — G43109 Migraine with aura, not intractable, without status migrainosus: Secondary | ICD-10-CM

## 2022-01-12 NOTE — Telephone Encounter (Signed)
Requested Prescriptions  ?Pending Prescriptions Disp Refills  ?? SUMAtriptan (IMITREX) 25 MG tablet [Pharmacy Med Name: SUMATRIPTAN SUCC 25 MG TABLET] 10 tablet 0  ?  Sig: TAKE 1 TABLET (25 MG TOTAL) BY MOUTH EVERY 2 (TWO) HOURS AS NEEDED FOR MIGRAINE. MAY REPEAT IN 2 HOURS IF HEADACHE PERSISTS OR RECURS. DO NOT TAKE MORE THAN TWICE A WEEK  ?  ? Neurology:  Migraine Therapy - Triptan Passed - 01/12/2022  2:54 AM  ?  ?  Passed - Last BP in normal range  ?  BP Readings from Last 1 Encounters:  ?12/28/21 118/80  ?   ?  ?  Passed - Valid encounter within last 12 months  ?  Recent Outpatient Visits   ?      ? 4 weeks ago Encounter for medical examination to establish care  ? [facility] Edwards, Celeste, PA-C  ?  ?  ?Future Appointments   ?        ? In 5 months Drubel, Lou Cal [facility]  ?  ? ?  ?  ?  ? ? ?

## 2022-01-17 ENCOUNTER — Other Ambulatory Visit: Payer: Self-pay | Admitting: Obstetrics and Gynecology

## 2022-01-17 DIAGNOSIS — Z3041 Encounter for surveillance of contraceptive pills: Secondary | ICD-10-CM

## 2022-01-19 ENCOUNTER — Ambulatory Visit: Payer: 59 | Admitting: Neurology

## 2022-01-19 ENCOUNTER — Ambulatory Visit (INDEPENDENT_AMBULATORY_CARE_PROVIDER_SITE_OTHER): Payer: 59 | Admitting: Neurology

## 2022-01-19 ENCOUNTER — Encounter: Payer: Self-pay | Admitting: Neurology

## 2022-01-19 VITALS — BP 154/93 | HR 106 | Ht 62.0 in | Wt 182.6 lb

## 2022-01-19 DIAGNOSIS — I10 Essential (primary) hypertension: Secondary | ICD-10-CM

## 2022-01-19 DIAGNOSIS — R519 Headache, unspecified: Secondary | ICD-10-CM | POA: Diagnosis not present

## 2022-01-19 DIAGNOSIS — R51 Headache with orthostatic component, not elsewhere classified: Secondary | ICD-10-CM

## 2022-01-19 DIAGNOSIS — G8929 Other chronic pain: Secondary | ICD-10-CM

## 2022-01-19 DIAGNOSIS — H539 Unspecified visual disturbance: Secondary | ICD-10-CM

## 2022-01-19 MED ORDER — PROPRANOLOL HCL ER 60 MG PO CP24: 60.0000 mg | ORAL_CAPSULE | Freq: Every day | ORAL | 6 refills | Status: DC

## 2022-01-19 NOTE — Progress Notes (Signed)
?[facility] ? ? ? ?Provider:  Dr Lucia GaskinsAhern ?Requesting Provider: Alfredia Fergusonrubel, Lindsay, PA-C ?Primary Care Provider:  Alfredia Fergusonrubel, Lindsay, PA-C ? ?CC:  headache ? ?HPI:  Mary Schwartz is a 26 y.o. female here as requested by Alfredia Fergusonrubel, Lindsay, PA-C for migraines. Had headaches several years ago, controlled by birth control, Just started a few months with worsening migraines has nothing to do with her preeclampsia which was in 2020. No inciting events. No in family with migraines, They are in her neck and in the forehead. Feels like a lot of pressure or throbbing, no nausea, light sensitivity, can be moderate to severe she had a really bad one woken up out of sleep which was the worse and was supine, blurry vision. These are completely different headaches. The imitrex helps. But stll having at least 2 headache/migraines days a week at least  has had 15 headache days in the last month, she has blurry vision, no numbness or tingling, hurts to move, photophobia, imitrex helps, no known triggers, sleep helped, tylenol a little bit, not signifcantly associated with period. No other focal neurologic deficits, associated symptoms, inciting events or modifiable factors. ? ? ?Reviewed notes, labs and imaging from outside physicians, which showed:  ? ?I reviewed Dr. Jeanann Lewandowskyrubel's notes: She reports chronic migraines, increasing in frequency to 3-4 times a week. Pain is in the center of her forehead. Usually just calls off work and goes to sleep. Reports associated light sensitivity, sometimes 'sees stars' during her headaches. Similar to when she was pre-eclamptic but reports normal blood pressures during this time. <130/90. Reports high levels of stress at home, two children, 527 y/o and 2 y/o, married, her MIL passed suddenly a few years ago. Reports small social network, works from home. Used to take Zoloft but took herself off. Denies having a therapist  ? ?From a thorough review of records, patient's medications tried  that can be used in migraine management include: Norvasc, tylenol, stadol, labetalol, zofran, oxy, sertraline ? ?Review of Systems: ?Patient complains of symptoms per HPI as well as the following symptoms HTN. Pertinent negatives and positives per HPI. All others negative. ? ? ?Social History  ? ?Socioeconomic History  ? Marital status: Married  ?  Spouse name: Not on file  ? Number of children: 1  ? Years of education: 4212  ? Highest education level: Not on file  ?Occupational History  ? Occupation: FOOD SERVICES  ?Tobacco Use  ? Smoking status: Never  ? Smokeless tobacco: Never  ?Vaping Use  ? Vaping Use: Never used  ?Substance and Sexual Activity  ? Alcohol use: No  ? Drug use: No  ? Sexual activity: Yes  ?  Birth control/protection: None  ?Other Topics Concern  ? Not on file  ?Social History Narrative  ? Caffeine rare occasion.  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    ? Married, 2 kids.    ? ?Social Determinants of Health  ? ?Financial Resource Strain: Not on file  ?Food Insecurity: Not on file  ?Transportation Needs: Not on file  ?Physical Activity: Not on file  ?Stress: Not on file  ?Social Connections: Not on file  ?Intimate Partner Violence: Not on file  ? ? ?Family History  ?Problem Relation Age of Onset  ? Hypertension Mother   ? Asthma Father   ? Breast cancer Maternal Grandmother   ?     350s, twice  ? Hypertension Maternal Grandmother   ? Migraines Neg Hx   ? ? ?Past Medical  History:  ?Diagnosis Date  ? Allergy   ? Anemia   ? Anxiety   ? Depression   ? ? ?Patient Active Problem List  ? Diagnosis Date Noted  ? Family history of breast cancer 12/29/2021  ? Migraine with aura and without status migrainosus, not intractable 12/14/2021  ? Chronic midline thoracic back pain 12/14/2021  ? Anxiety and depression 12/14/2021  ? Antepartum mild preeclampsia 08/04/2019  ? ? ?Past Surgical History:  ?Procedure Laterality Date  ? NO PAST SURGERIES    ? ? ?Current Outpatient Medications  ?Medication Sig Dispense Refill   ? cyclobenzaprine (FLEXERIL) 5 MG tablet Take 1 tablet (5 mg total) by mouth 3 (three) times daily as needed for muscle spasms. 30 tablet 1  ? norethindrone-ethinyl estradiol-FE (JUNEL FE 1/20) 1-20 MG-MCG tablet Take 1 tablet by mouth daily. 28 tablet 0  ? propranolol ER (INDERAL LA) 60 MG 24 hr capsule Take 1 capsule (60 mg total) by mouth at bedtime. 30 capsule 6  ? SUMAtriptan (IMITREX) 25 MG tablet TAKE 1 TABLET (25 MG TOTAL) BY MOUTH EVERY 2 (TWO) HOURS AS NEEDED FOR MIGRAINE. MAY REPEAT IN 2 HOURS IF HEADACHE PERSISTS OR RECURS. DO NOT TAKE MORE THAN TWICE A WEEK 10 tablet 0  ? ?No current facility-administered medications for this visit.  ? ? ?Allergies as of 01/19/2022  ? (No Known Allergies)  ? ? ?Vitals: ?BP (!) 154/93   Pulse (!) 106   Ht 5\' 2"  (1.575 m)   Wt 182 lb 9.6 oz (82.8 kg)   LMP 12/22/2021 (Exact Date)   BMI 33.40 kg/m?  ?Last Weight:  ?Wt Readings from Last 1 Encounters:  ?01/19/22 182 lb 9.6 oz (82.8 kg)  ? ?Last Height:   ?Ht Readings from Last 1 Encounters:  ?01/19/22 5\' 2"  (1.575 m)  ? ? ? ?Physical exam: ?Exam: ?Gen: NAD, conversant, well nourised, obese, well groomed                     ?CV: tachcardic regular rhythm, no MRG. No Carotid Bruits. No peripheral edema, warm, nontender ?Eyes: Conjunctivae clear without exudates or hemorrhage ? ?Neuro: ?Detailed Neurologic Exam ? ?Speech: ?   Speech is normal; fluent and spontaneous with normal comprehension.  ?Cognition: ?   The patient is oriented to person, place, and time;  ?   recent and remote memory intact;  ?   language fluent;  ?   normal attention, concentration,  ?   fund of knowledge ?Cranial Nerves: ?   The pupils are equal, round, and reactive to light. The fundi are normal and spontaneous venous pulsations are present. Visual fields are full to finger confrontation. Extraocular movements are intact. Trigeminal sensation is intact and the muscles of mastication are normal. The face is symmetric. The palate elevates in the  midline. Hearing intact. Voice is normal. Shoulder shrug is normal. The tongue has normal motion without fasciculations.  ? ?Coordination: ?   Normal ? ?Gait: ?    normal.  ? ?Motor Observation: ?   No asymmetry, no atrophy, and no involuntary movements noted. ?Tone: ?   Normal muscle tone.   ? ?Posture: ?   Posture is normal. normal erect ?   ?Strength: ?   Strength is V/V in the upper and lower limbs.  ?    ?Sensation: intact to LT ?    ?Reflex Exam: ? ?DTR's: ?   Deep tendon reflexes in the upper and lower extremities are normal bilaterally.   ?  Toes: ?   The toes are downgoing bilaterally.   ?Clonus: ?   Clonus is absent. ?  ? ?Assessment/Plan: This is a 26 year old patient with a past medical history of preeclampsia in 2020 who is here with 3 months of worsening headaches, her blood pressure is elevated today(154/93 P 106) and she is tacky at 106, her symptoms are migrainous.  But still hard to say whether she is developing migraines or whether this has anything to do with her blood pressure.  She also has some concerning symptoms we should get an MRI of the brain. ? ?MRI brain due to concerning symptoms of morning headaches, positional ,vision changes, worsening headaches  to look for space occupying mass, chiari or intracranial hypertension (pseudotumor), strokes, malignancies, vasculidities, demyelination(multiple sclerosis) or other ? ?Preventative: Propranolol 60mg  at bedtime  - CONTACT in 2 -3 weeks and we may increase ?Acute: Imitrex/Sumatriptan ?Getting a blood pressure cuff and check BP at home 2-3x a day ?Cholesterol was slightly elevated as was your HgbA1c(prediabetes) recommend healthy lifestyle, change in diet, [facility], f/u pcp ?Obesity: may also consider sleep study if indicatd, recommend pcp screen for this ? ? ?Orders Placed This Encounter  ?Procedures  ? MR BRAIN W WO CONTRAST  ? ?Meds ordered this encounter  ?Medications  ? propranolol ER (INDERAL LA) 60 MG 24 hr  capsule  ?  Sig: Take 1 capsule (60 mg total) by mouth at bedtime.  ?  Dispense:  30 capsule  ?  Refill:  6  ? ? ?Cc: [place], PA-C,  [name], PA-C ? ?[name], MD ? ?[facility]

## 2022-01-19 NOTE — Patient Instructions (Addendum)
Preventative: Propranolol 60mg  at bedtime  - CONTACT US in 2 -3 weeks and we may increase ?Acute: Imitrex/Sumatriptan ?MRI if the brain with contrast  ?Getting a blood pressure cuff and check BP at home 2-3x a day ?Cholesterol was slightly elevated as was your HgbA1c(prediabetes) recommend healthy lifestyle, change in diet, [facility] ? ?Migraine Headache ?A migraine headache is an intense, throbbing pain on one side or both sides of the head. Migraine headaches may also cause other symptoms, such as nausea, vomiting, and sensitivity to light and noise. A migraine headache can last from 4 hours to 3 days. Talk with your doctor about what things may bring on (trigger) your migraine headaches. ?What are the causes? ?The exact cause of this condition is not known. However, a migraine may be caused when nerves in the brain become irritated and release chemicals that cause inflammation of blood vessels. This inflammation causes pain. This condition may be triggered or caused by: ?Drinking alcohol. ?Smoking. ?Taking medicines, such as: ?Medicine used to treat chest pain (nitroglycerin). ?Birth control pills. ?Estrogen. ?Certain blood pressure medicines. ?Eating or drinking products that contain nitrates, glutamate, aspartame, or tyramine. Aged cheeses, chocolate, or caffeine may also be triggers. ?Doing physical activity. ?Other things that may trigger a migraine headache include: ?Menstruation. ?Pregnancy. ?Hunger. ?Stress. ?Lack of sleep or too much sleep. ?Weather changes. ?Fatigue. ?What increases the risk? ?The following factors may make you more likely to experience migraine headaches: ?Being a certain age. This condition is more common in people who are 7625-26 years old. ?Being female. ?Having a family history of migraine headaches. ?Being Caucasian. ?Having a mental health condition, such as depression or anxiety. ?Being obese. ?What are the signs or symptoms? ?The main symptom of this  condition is pulsating or throbbing pain. This pain may: ?Happen in any area of the head, such as on one side or both sides. ?Interfere with daily activities. ?Get worse with physical activity. ?Get worse with exposure to bright lights or loud noises. ?Other symptoms may include: ?Nausea. ?Vomiting. ?Dizziness. ?General sensitivity to bright lights, loud noises, or smells. ?Before you get a migraine headache, you may get warning signs (an aura). An aura may include: ?Seeing flashing lights or having blind spots. ?Seeing bright spots, halos, or zigzag lines. ?Having tunnel vision or blurred vision. ?Having numbness or a tingling feeling. ?Having trouble talking. ?Having muscle weakness. ?Some people have symptoms after a migraine headache (postdromal phase), such as: ?Feeling tired. ?Difficulty concentrating. ?How is this diagnosed? ?A migraine headache can be diagnosed based on: ?Your symptoms. ?A physical exam. ?Tests, such as: ?CT scan or an MRI of the head. These imaging tests can help rule out other causes of headaches. ?Taking fluid from the spine (lumbar puncture) and analyzing it (cerebrospinal fluid analysis, or CSF analysis). ?How is this treated? ?This condition may be treated with medicines that: ?Relieve pain. ?Relieve nausea. ?Prevent migraine headaches. ?Treatment for this condition may also include: ?Acupuncture. ?Lifestyle changes like avoiding foods that trigger migraine headaches. ?Biofeedback. ?Cognitive behavioral therapy. ?Follow these instructions at home: ?Medicines ?Take over-the-counter and prescription medicines only as told by your health care provider. ?Ask your health care provider if the medicine prescribed to you: ?Requires you to avoid driving or using heavy machinery. ?Can cause constipation. You may need to take these actions to prevent or treat constipation: ?Drink enough fluid to keep your urine pale yellow. ?Take over-the-counter or prescription medicines. ?Eat foods that are  high in fiber, such as beans,  whole grains, and fresh fruits and vegetables. ?Limit foods that are high in fat and processed sugars, such as fried or sweet foods. ?Lifestyle ?Do not drink alcohol. ?Do not use any products that contain nicotine or tobacco, such as cigarettes, e-cigarettes, and chewing tobacco. If you need help quitting, ask your health care provider. ?Get at least 8 hours of sleep every night. ?Find ways to manage stress, such as meditation, deep breathing, or yoga. ?General instructions ? ?  ? ?Keep a journal to find out what may trigger your migraine headaches. For example, write down: ?What you eat and drink. ?How much sleep you get. ?Any change to your diet or medicines. ?If you have a migraine headache: ?Avoid things that make your symptoms worse, such as bright lights. ?It may help to lie down in a dark, quiet room. ?Do not drive or use heavy machinery. ?Ask your health care provider what activities are safe for you while you are experiencing symptoms. ?Keep all follow-up visits as told by your health care provider. This is important. ?Contact a health care provider if: ?You develop symptoms that are different or more severe than your usual migraine headache symptoms. ?You have more than 15 headache days in one month. ?Get help right away if: ?Your migraine headache becomes severe. ?Your migraine headache lasts longer than 72 hours. ?You have a fever. ?You have a stiff neck. ?You have vision loss. ?Your muscles feel weak or like you cannot control them. ?You start to lose your balance often. ?You have trouble walking. ?You faint. ?You have a seizure. ?Summary ?A migraine headache is an intense, throbbing pain on one side or both sides of the head. Migraines may also cause other symptoms, such as nausea, vomiting, and sensitivity to light and noise. ?This condition may be treated with medicines and lifestyle changes. You may also need to avoid certain things that trigger a migraine  headache. ?Keep a journal to find out what may trigger your migraine headaches. ?Contact your health care provider if you have more than 15 headache days in a month or you develop symptoms that are different or more severe than your usual migraine headache symptoms. ?This information is not intended to replace advice given to you by your health care provider. Make sure you discuss any questions you have with your health care provider. ?Document Revised: 12/20/2018 Document Reviewed: 10/10/2018 ?Elsevier Patient Education ? 2023 [employer] ?General Headache Without Cause ?A headache is pain or discomfort felt around the head or neck area. There are many causes and types of headaches. A few common types include: ?Tension headaches. ?Migraine headaches. ?Cluster headaches. ?Chronic daily headaches. ?Sometimes, the specific cause of a headache may not be found. ?Follow these instructions at home: ?Watch your condition for any changes. Let your health care provider know about them. Take these steps to help with your condition: ?Managing pain ? ?  ? ?Take over-the-counter and prescription medicines only as told by your health care provider. Treatment may include medicines for pain that are taken by mouth or applied to the skin. ?Lie down in a dark, quiet room when you have a headache. ?Keep lights dim if bright lights bother you or make your headaches worse. ?If directed, put ice on your head and neck area: ?Put ice in a plastic bag. ?Place a towel between your skin and the bag. ?Leave the ice on for 20 minutes, 2-3 times per day. ?Remove the ice if your skin turns bright red. This is very important. If  you cannot feel pain, heat, or cold, you have a greater risk of damage to the area. ?If directed, apply heat to the affected area. Use the heat source that your health care provider recommends, such as a moist heat pack or a heating pad. ?Place a towel between your skin and the heat source. ?Leave the heat on for 20-30  minutes. ?Remove the heat if your skin turns bright red. This is especially important if you are unable to feel pain, heat, or cold. You have a greater risk of getting burned. ?Eating and drinking ?Eat meals on a regular s

## 2022-01-20 ENCOUNTER — Other Ambulatory Visit: Payer: Self-pay | Admitting: Obstetrics and Gynecology

## 2022-01-20 DIAGNOSIS — Z3041 Encounter for surveillance of contraceptive pills: Secondary | ICD-10-CM

## 2022-01-23 ENCOUNTER — Telehealth: Payer: Self-pay | Admitting: Neurology

## 2022-01-23 ENCOUNTER — Other Ambulatory Visit: Payer: Self-pay | Admitting: Obstetrics and Gynecology

## 2022-01-23 ENCOUNTER — Encounter: Payer: Self-pay | Admitting: Obstetrics and Gynecology

## 2022-01-23 DIAGNOSIS — Z3041 Encounter for surveillance of contraceptive pills: Secondary | ICD-10-CM

## 2022-01-23 MED ORDER — NORETHIN ACE-ETH ESTRAD-FE 1-20 MG-MCG PO TABS: 1.0000 | ORAL_TABLET | Freq: Every day | ORAL | 0 refills | Status: DC

## 2022-01-23 NOTE — Telephone Encounter (Signed)
01/23/22 LVM for pt to call back to schedule EE  ? ?01/19/22 [employer] LMRA:[id number] exp. 01/19/22-03/05/22 [facility] ?

## 2022-01-24 ENCOUNTER — Other Ambulatory Visit: Payer: Self-pay | Admitting: Obstetrics and Gynecology

## 2022-01-24 DIAGNOSIS — Z3041 Encounter for surveillance of contraceptive pills: Secondary | ICD-10-CM

## 2022-01-24 MED ORDER — NORETHIN ACE-ETH ESTRAD-FE 1-20 MG-MCG PO TABS: 1.0000 | ORAL_TABLET | Freq: Every day | ORAL | 3 refills | Status: DC

## 2022-01-24 NOTE — Progress Notes (Signed)
Rx RF OCPs. Going to postpone IUD. ? ?Meds ordered this encounter  ?Medications  ? norethindrone-ethinyl estradiol-FE (JUNEL FE 1/20) 1-20 MG-MCG tablet  ?  Sig: Take 1 tablet by mouth daily.  ?  Dispense:  84 tablet  ?  Refill:  3  ?  Order Specific Question:   Supervising Provider  ?  Answer:   CONSTANT, PEGGY [[id number]]  ? ? ?

## 2022-01-30 ENCOUNTER — Encounter: Payer: Self-pay | Admitting: Obstetrics and Gynecology

## 2022-01-30 ENCOUNTER — Telehealth: Payer: Self-pay | Admitting: Neurology

## 2022-01-30 NOTE — Telephone Encounter (Signed)
Sent MyChart msg informing pt of appt date change- MD out 8/28.

## 2022-02-07 ENCOUNTER — Telehealth: Payer: Self-pay | Admitting: Obstetrics and Gynecology

## 2022-02-07 NOTE — Telephone Encounter (Signed)
Pt aware of neg MyRisk results. IBIS=15.6%/riskscore=15.0%. No extra screening recommendations at this time.  Patient understands these results only apply to her and her children, and this is not indicative of genetic testing results of her other family members. It is recommended that her other family members have genetic testing done.  Pt also understands negative genetic testing doesn't mean she will never get any of these cancers.   Hard copy mailed to pt. F/u prn.

## 2022-02-07 NOTE — Telephone Encounter (Signed)
[facility] today and 01/30/22 re: My Risk results.

## 2022-05-01 ENCOUNTER — Encounter: Payer: Self-pay | Admitting: Neurology

## 2022-05-01 ENCOUNTER — Telehealth (INDEPENDENT_AMBULATORY_CARE_PROVIDER_SITE_OTHER): Payer: Self-pay | Admitting: Neurology

## 2022-05-01 ENCOUNTER — Telehealth: Payer: Self-pay | Admitting: Neurology

## 2022-05-01 DIAGNOSIS — Z91199 Patient's noncompliance with other medical treatment and regimen due to unspecified reason: Secondary | ICD-10-CM

## 2022-05-01 NOTE — Progress Notes (Signed)
5/3: canceled without 24 hours notice new patient 05/01/2022: no showed follow up   Mary Schwartz no showed her follow up appointment today.    Something could have happened today and if so we totally understand but if she calls to reschedule please let her know of our office dismissal policy: if she no-shows again there may be dismissal from practice.  thanks.

## 2022-05-01 NOTE — Telephone Encounter (Signed)
5/3: canceled without 24 hours notice new patient 05/01/2022: no showed follow up   Mary Schwartz no showed her follow up appointment today.    Something could have happened today and if so we totally understand but if she no-shows again please let her know know of out office dismissal policy thanks.

## 2022-05-08 ENCOUNTER — Telehealth: Payer: 59 | Admitting: Neurology

## 2022-06-15 ENCOUNTER — Ambulatory Visit: Payer: 59 | Admitting: Physician Assistant

## 2022-07-03 ENCOUNTER — Encounter: Payer: Self-pay | Admitting: Physician Assistant

## 2022-07-14 ENCOUNTER — Other Ambulatory Visit: Payer: Self-pay | Admitting: Neurology

## 2022-09-08 NOTE — Progress Notes (Deleted)
      Established patient visit   Patient: Mary Schwartz   DOB: [birth year]   26 y.o. Female  MRN: [id number] Visit Date: 09/12/2022  Today's healthcare provider: [name], PA-C   No chief complaint on file.  Subjective    HPI  ***  Medications: Outpatient Medications Prior to Visit  Medication Sig   cyclobenzaprine (FLEXERIL) 5 MG tablet Take 1 tablet (5 mg total) by mouth 3 (three) times daily as needed for muscle spasms.   norethindrone-ethinyl estradiol-FE (JUNEL FE 1/20) 1-20 MG-MCG tablet Take 1 tablet by mouth daily.   propranolol ER (INDERAL LA) 60 MG 24 hr capsule TAKE 1 CAPSULE (60 MG TOTAL) BY MOUTH AT BEDTIME.   SUMAtriptan (IMITREX) 25 MG tablet TAKE 1 TABLET (25 MG TOTAL) BY MOUTH EVERY 2 (TWO) HOURS AS NEEDED FOR MIGRAINE. MAY REPEAT IN 2 HOURS IF HEADACHE PERSISTS OR RECURS. DO NOT TAKE MORE THAN TWICE A WEEK   No facility-administered medications prior to visit.    Review of Systems  {Labs  Heme  Chem  Endocrine  Serology  Results Review (optional):23779}   Objective    There were no vitals taken for this visit. {Show previous vital signs (optional):23777}  Physical Exam  ***  No results found for any visits on 09/12/22.  Assessment & Plan     ***  No follow-ups on file.      {provider attestation***:1}   [name], PA-C  [facility] [phone] (phone) [phone] (fax)  [facility]

## 2022-09-12 ENCOUNTER — Ambulatory Visit: Payer: 59 | Admitting: Physician Assistant

## 2022-12-19 ENCOUNTER — Other Ambulatory Visit: Payer: Self-pay | Admitting: Obstetrics and Gynecology

## 2022-12-19 DIAGNOSIS — Z3041 Encounter for surveillance of contraceptive pills: Secondary | ICD-10-CM

## 2022-12-28 NOTE — Telephone Encounter (Signed)
Open encounter in error..

## 2023-03-06 ENCOUNTER — Encounter: Payer: Self-pay | Admitting: Physician Assistant

## 2023-06-22 NOTE — Progress Notes (Addendum)
See Dr. Barton Fanny note

## 2023-06-25 ENCOUNTER — Encounter: Payer: Self-pay | Admitting: Obstetrics

## 2023-06-25 ENCOUNTER — Other Ambulatory Visit (HOSPITAL_COMMUNITY)
Admission: RE | Admit: 2023-06-25 | Discharge: 2023-06-25 | Disposition: A | Discharge status: Home / Self Care | Payer: 59 | Source: Ambulatory Visit | Attending: Obstetrics | Admitting: Obstetrics

## 2023-06-25 ENCOUNTER — Ambulatory Visit: Payer: 59 | Admitting: Obstetrics

## 2023-06-25 VITALS — BP 128/84 | HR 91 | Ht 62.0 in | Wt 187.0 lb

## 2023-06-25 DIAGNOSIS — Z3202 Encounter for pregnancy test, result negative: Secondary | ICD-10-CM | POA: Diagnosis not present

## 2023-06-25 DIAGNOSIS — Z01419 Encounter for gynecological examination (general) (routine) without abnormal findings: Secondary | ICD-10-CM | POA: Insufficient documentation

## 2023-06-25 DIAGNOSIS — Z124 Encounter for screening for malignant neoplasm of cervix: Secondary | ICD-10-CM

## 2023-06-25 DIAGNOSIS — Z113 Encounter for screening for infections with a predominantly sexual mode of transmission: Secondary | ICD-10-CM

## 2023-06-25 DIAGNOSIS — Z23 Encounter for immunization: Secondary | ICD-10-CM

## 2023-06-25 DIAGNOSIS — Z30011 Encounter for initial prescription of contraceptive pills: Secondary | ICD-10-CM

## 2023-06-25 DIAGNOSIS — Z3201 Encounter for pregnancy test, result positive: Secondary | ICD-10-CM

## 2023-06-25 LAB — POCT URINE PREGNANCY: Preg Test, Ur: POSITIVE — AB

## 2023-06-25 MED ORDER — NORETHIN ACE-ETH ESTRAD-FE 1-20 MG-MCG PO TABS: 1.0000 | ORAL_TABLET | Freq: Every day | ORAL | 4 refills | Status: DC

## 2023-06-25 NOTE — Progress Notes (Signed)
Expand All Collapse All       Outpatient Gynecology Note: Annual Visit     Subjective:    Subjective Mary Schwartz is a 28 y.o. female G2P2002 who presents for annual wellness visit. Would like full STD testing today as part of exam. No symptoms or known exposures.    Occupation Programmer, [employer]   Lives with Husband and kids      CONCERNS?   July 18th positive pregnancy test, Aug 9th had heavy bleeding, tested and was negative pregnancy test, Oct 12-13 lite bleeding     Also wanting to restart OCPs, previously took [place] 1/20 and liked, requesting same. Denies hx of migraines with aura, VTE, and does not smoke.    Well Woman Visit:  GYN HISTORY:  Patient's last menstrual period was 06/13/2023.     Menstrual History: OB History       Gravida  2   Para  2   Term  2   Preterm      AB      Living  2        SAB      IAB      Ectopic      Multiple  0   Live Births  2             Menarche age: 63 Patient's last menstrual period was 06/13/2023. Period Cycle (Days): 28 Period Duration (Days): 4-5 Period Pattern: Regular Menstrual Flow: Moderate, Light Dysmenorrhea: (!) Mild Dysmenorrhea Symptoms: Cramping   Periods are regular and last 4-5 days. Dysmenorrhea: denies. Cyclic symptoms include: mild cramping Intermenstrual bleeding, spotting, or discharge? No Urinary incontinence? no   Sexually active: yes  Number of sexual partners: 1 Gender of sexual Partners: Males Dyspareunia? no   Last VQQ:VZDGLOVFIEP date 12/2021 and was normal (NILM) History of abnormal Pap: no Gardasil series:  unsure, would like first dose today STI history: +TV 2020, treated STI/HIV testing or immunizations needed? Yes.    Contraceptive methods:  none but wants to go back on the pills    Health Maintenance > Reviewed breast self-awareness > History of abnormal mammogram: No > Exercise: not asked,  > Dietary Supplements: Folate: No;  Calcium: No}; Vitamin D:  No > Body mass index is 34.2 kg/m.  > Recent dental visit Yes.   > Seat Belt Use: Yes.   > Texting and driving? No. > Guns in the house No. > Concern for alcohol abuse? no    Tobacco or other drug use: denied.     Tobacco Use: Low Risk  (06/25/2023)    Patient History     Smoking Tobacco Use: Never     Smokeless Tobacco Use: Never     Passive Exposure: Not on file      PHQ-2 Score: In last two weeks, how often have you felt: Little interest or pleasure in doing things: Not at all (0) Feeling down, depressed or hopeless: Not at all (0) Score:    Hep C Screening: done 2023 _________________________________________________________ Allergies  No Known Allergies         Past Medical History:  Diagnosis Date   Allergy     Anemia     Anxiety     BRCA negative 01/2022    MyRisk neg except BAP1 and MSH2 VUS; IBIS=15.6%/riskscore=15.0%   Depression     Family history of breast cancer               Past Surgical History:  Procedure Laterality Date   NO PAST SURGERIES            OB History       Gravida  2   Para  2   Term  2   Preterm      AB      Living  2        SAB      IAB      Ectopic      Multiple  0   Live Births  2             Social History        Tobacco Use   Smoking status: Never   Smokeless tobacco: Never  Substance Use Topics   Alcohol use: No    Social History        Substance and Sexual Activity  Sexual Activity Yes   Birth control/protection: None          Immunization History  Administered Date(s) Administered   Tdap 06/06/2019        Review Of Systems  Constitutional: Denied constitutional symptoms, night sweats, recent illness, fatigue, fever, insomnia and weight loss.  Eyes: Denied eye symptoms, eye pain, photophobia, vision change and visual disturbance.  Ears/Nose/Throat/Neck: Denied ear, nose, throat or neck symptoms, hearing loss, nasal discharge, sinus congestion and sore throat.   Cardiovascular: Denied cardiovascular symptoms, arrhythmia, chest pain/pressure, edema, exercise intolerance, orthopnea and palpitations.  Respiratory: Denied pulmonary symptoms, asthma, pleuritic pain, productive sputum, cough, dyspnea and wheezing.  Gastrointestinal: Denied, gastro-esophageal reflux, melena, nausea and vomiting.  Genitourinary: Denied genitourinary symptoms including symptomatic vaginal discharge, pelvic relaxation issues, and urinary complaints.  Musculoskeletal: Denied musculoskeletal symptoms, stiffness, swelling, muscle weakness and myalgia.  Dermatologic: Denied dermatology symptoms, rash and scar.  Neurologic: Denied neurology symptoms, dizziness, headache, neck pain and syncope.  Psychiatric: Denied psychiatric symptoms, anxiety and depression.  Endocrine: Denied endocrine symptoms including hot flashes and night sweats.          Objective:    Objective BP 128/84   Pulse 91   Ht 5\' 2"  (1.575 m)   Wt 187 lb (84.8 kg)   LMP 06/13/2023   BMI 34.20 kg/m    Constitutional: Well-developed, well-nourished female in no acute distress Neurological: Alert and oriented to person, place, and time Psychiatric: Mood and affect appropriate Skin: No rashes or lesions Neck: Supple without masses. Trachea is midline.Thyroid is normal size without masses Lymphatics: No cervical, axillary, supraclavicular, or inguinal adenopathy noted Respiratory: Clear to auscultation bilaterally. Good air movement with normal work of breathing. Cardiovascular: Regular rate and rhythm. Extremities grossly normal, nontender with no edema; pulses regular Gastrointestinal: Soft, nontender, nondistended. No masses or hernias appreciated. No hepatosplenomegaly. No fluid wave. No rebound or guarding. Breast Exam: normal appearance, no masses or tenderness, No nipple retraction or dimpling, No nipple discharge or bleeding, No axillary or supraclavicular adenopathy, Normal to palpation without  dominant masses, Taught monthly breast self examination Genitourinary:                            External Genitalia: Normal female genitalia               Vagina: no discharge, no lesions.               Cervix: No lesions, normal size and consistency; no cervical motion tenderness  Uterus: Normal size and contour; smooth, mobile, NT, midposition. Adnexae: Non-palpable and non-tender Perineum/Anus: No lesions Rectal: deferred       Assessment/Plan:    Plan Mary Schwartz is a 27 y.o. female G2P2002 with normal well-woman gynecologic exam. -Screenings:  Pap: NILM in 2023, due 12/2025 Mammogram: N/A Labs: STI screening by vaginal swab and labs PHQ-2 = 0 -Contraception: see below -Vaccines: pt declines Influenza. Gardasil: series not started, dose #1 today.  -Healthy lifestyle modifications discussed: multivitamin, diet, exercise, sunscreen. Emphasized importance of regular physical activity.  -Folic Acid recommendation reviewed.  -All questions answered to patient's satisfaction.  -RTC 1 yr for annual, sooner prn.    Contraception:  -Different birth control methods including LARCs discussed/offered. -Urine HCG negative today. -Start oral contraceptives as prescribed: Junel 1/20, will take cyclically. -Cardiovascular and other risks discussed, aware that smoking while on OCPs increases the risk of blood clots. SEs discussed, including BTB, N&V, weight changes, increased moodiness. If symptoms are severe, or mild SEs do not improve after 3 mos, call or RTC to discuss options.  -Refrain from intercourse or use barrier contraception x 7 days after starting OCPs.  -Aware that OCPs protect against pregnancy only, not STDs. -RTC in 12 mos for refills, sooner prn.     Julieanne Manson, DO [facility]

## 2023-06-25 NOTE — Addendum Note (Signed)
Addended by: Cornelius Moras D on: 06/25/2023 09:25 AM   Modules accepted: Orders

## 2023-06-25 NOTE — Patient Instructions (Signed)
Breast Self-Awareness Breast self-awareness is knowing how your breasts look and feel. You need to: Check your breasts on a regular basis. Tell your doctor about any changes. Become familiar with the look and feel of your breasts. This can help you catch a breast problem while it is still small and can be treated. You should do breast self-exams even if you have breast implants. What you need: A mirror. A well-lit room. A pillow or other soft object. How to do a breast self-exam Follow these steps to do a breast self-exam: Look for changes  Take off all the clothes above your waist. Stand in front of a mirror in a room with good lighting. Put your hands down at your sides. Compare your breasts in the mirror. Look for any difference between them, such as: A difference in shape. A difference in size. Wrinkles, dips, and bumps in one breast and not the other. Look at each breast for changes in the skin, such as: Redness. Scaly areas. Skin that has gotten thicker. Dimpling. Open sores (ulcers). Look for changes in your nipples, such as: Fluid coming out of a nipple. Fluid around a nipple. Bleeding. Dimpling. Redness. A nipple that looks pushed in (retracted), or that has changed position. Feel for changes Lie on your back. Feel each breast. To do this: Pick a breast to feel. Place a pillow under the shoulder closest to that breast. Put the arm closest to that breast behind your head. Feel the nipple area of that breast using the hand of your other arm. Feel the area with the pads of your three middle fingers by making small circles with your fingers. Use light, medium, and firm pressure. Continue the overlapping circles, moving downward over the breast. Keep making circles with your fingers. Stop when you feel your ribs. Start making circles with your fingers again, this time going upward until you reach your collarbone. Then, make circles outward across your breast and into your  armpit area. Squeeze your nipple. Check for discharge and lumps. Repeat these steps to check your other breast. Sit or stand in the tub or shower. With soapy water on your skin, feel each breast the same way you did when you were lying down. Write down what you find Writing down what you find can help you remember what to tell your doctor. Write down: What is normal for each breast. Any changes you find in each breast. These include: The kind of changes you find. A tender or painful breast. Any lump you find. Write down its size and where it is. When you last had your monthly period (menstrual cycle). General tips If you are breastfeeding, the best time to check your breasts is after you feed your baby or after you use a breast pump. If you get monthly bleeding, the best time to check your breasts is 5-7 days after your monthly cycle ends. With time, you will become comfortable with the self-exam. You will also start to know if there are changes in your breasts. Contact a doctor if: You see a change in the shape or size of your breasts or nipples. You see a change in the skin of your breast or nipples, such as red or scaly skin. You have fluid coming from your nipples that is not normal. You find a new lump or thick area. You have breast pain. You have any concerns about your breast health. Summary Breast self-awareness includes looking for changes in your breasts and feeling for changes  within your breasts. You should do breast self-awareness in front of a mirror in a well-lit room. If you get monthly periods (menstrual cycles), the best time to check your breasts is 5-7 days after your period ends. Tell your doctor about any changes you see in your breasts. Changes include changes in size, changes on the skin, painful or tender breasts, or fluid from your nipples that is not normal. This information is not intended to replace advice given to you by your health care provider. Make sure  you discuss any questions you have with your health care provider. Document Revised: 02/02/2022 Document Reviewed: 06/30/2021 Elsevier Patient Education  2024 [employer].

## 2023-06-26 LAB — CERVICOVAGINAL ANCILLARY ONLY
Chlamydia: NEGATIVE
Comment: NEGATIVE
Comment: NEGATIVE
Comment: NORMAL
Neisseria Gonorrhea: NEGATIVE
Trichomonas: NEGATIVE

## 2023-06-26 LAB — HEP, RPR, HIV PANEL
HIV Screen 4th Generation wRfx: NONREACTIVE
Hepatitis B Surface Ag: NEGATIVE
RPR Ser Ql: NONREACTIVE

## 2023-06-26 LAB — BETA HCG QUANT (REF LAB): hCG Quant: 4 m[IU]/mL

## 2023-08-31 ENCOUNTER — Ambulatory Visit: Payer: 59

## 2023-08-31 VITALS — BP 125/93 | HR 96 | Wt 189.7 lb

## 2023-08-31 DIAGNOSIS — Z23 Encounter for immunization: Secondary | ICD-10-CM | POA: Diagnosis not present

## 2023-08-31 NOTE — Progress Notes (Signed)
    NURSE VISIT NOTE  Subjective:    Patient ID: LISBET MCCADDEN, female    DOB: 09-20-1995, 27 y.o.   MRN: [id number]  HPI  Patient is a 27 y.o. G27P2002 female Married [place] American female who presents for her second Gardasil injection. Order to administer given by Julieanne Manson, MD on 08/31/2023.   Objective:    BP (!) 125/93   Pulse 96   Wt 189 lb 11.2 oz (86 kg)   BMI 34.70 kg/m   27 y.o. LMP: within weeks  Contraception:   Junel FE Given by: Sheliah Hatch, CMA Site:  left deltoid  Immunization History  Administered Date(s) Administered   HPV 9-valent 06/25/2023, 08/31/2023   Tdap 06/06/2019     Lab Review  No results found for any visits on 08/31/23.    Assessment:   1. Need for HPV vaccine      Plan:   Patient will return in 4 months for third injection.    Fonda Kinder, CMA

## 2023-08-31 NOTE — Patient Instructions (Addendum)
HPV (Human Papillomavirus) Vaccine: What You Need to Know Many vaccine information statements are available in Spanish and other languages. See PromoAge.com.br. 1. Why get vaccinated? HPV (human papillomavirus) vaccine can prevent infection with some types of human papillomavirus. HPV infections can cause certain types of cancers, including: cervical, vaginal, and vulvar cancers in women penile cancer in men anal cancers in both men and women cancers of tonsils, base of tongue, and back of throat (oropharyngeal cancer) in both men and women HPV infections can also cause anogenital warts. HPV vaccine can prevent over 90% of cancers caused by HPV. HPV is spread through intimate skin-to-skin or sexual contact. HPV infections are so common that nearly all people will get at least one type of HPV at some time in their lives. Most HPV infections go away on their own within 2 years. But sometimes HPV infections will last longer and can cause cancers later in life. 2. HPV vaccine HPV vaccine is routinely recommended for adolescents at 53 or 27 years of age to ensure they are protected before they are exposed to the virus. HPV vaccine may be given beginning at age 20 years and vaccination is recommended for everyone through 27 years of age. HPV vaccine may be given to adults 27 through 27 years of age, based on discussions between the patient and health care provider. Most children who get the first dose before 32 years of age need 2 doses of HPV vaccine. People who get the first dose at or after 13 years of age and younger people with certain immunocompromising conditions need 3 doses. Your health care provider can give you more information. HPV vaccine may be given at the same time as other vaccines. 3. Talk with your health care provider Tell your vaccination provider if the person getting the vaccine: Has had an allergic reaction after a previous dose of HPV vaccine, or has any severe,  life-threatening allergies Is pregnant--HPV vaccine is not recommended until after pregnancy In some cases, your health care provider may decide to postpone HPV vaccination until a future visit. People with minor illnesses, such as a cold, may be vaccinated. People who are moderately or severely ill should usually wait until they recover before getting HPV vaccine. Your health care provider can give you more information. 4. Risks of a vaccine reaction Soreness, redness, or swelling where the shot is given can happen after HPV vaccination. Fever or headache can happen after HPV vaccination. People sometimes faint after medical procedures, including vaccination. Tell your provider if you feel dizzy or have vision changes or ringing in the ears. As with any medicine, there is a very remote chance of a vaccine causing a severe allergic reaction, other serious injury, or death. 5. What if there is a serious problem? An allergic reaction could occur after the vaccinated person leaves the clinic. If you see signs of a severe allergic reaction (hives, swelling of the face and throat, difficulty breathing, a fast heartbeat, dizziness, or weakness), call 9-1-1 and get the person to the nearest hospital. For other signs that concern you, call your health care provider. Adverse reactions should be reported to the Vaccine Adverse Event Reporting System (VAERS). Your health care provider will usually file this report, or you can do it yourself. Visit the VAERS website at www.vaers.LAgents.no or call 949-590-5312. VAERS is only for reporting reactions, and VAERS staff members do not give medical advice. 6. The National Vaccine Injury Compensation Program The [employer] Vaccine Injury Compensation Program (VICP) is a  federal program that was created to compensate people who may have been injured by certain vaccines. Claims regarding alleged injury or death due to vaccination have a time limit for filing, which may be as  short as two years. Visit the VICP website at SpiritualWord.at or call (479) 412-1262 to learn about the program and about filing a claim. 7. How can I learn more? Ask your health care provider. Call your local or state health department. Visit the website of the Food and Drug Administration (FDA) for vaccine package inserts and additional information at FinderList.no. Contact the Centers for Disease Control and Prevention (CDC): Call 979-369-9733 (1-800-CDC-INFO) or Visit CDC's website at PicCapture.uy. Source: CDC Vaccine Information Statement HPV Vaccine (04/16/2020) This same material is available at FootballExhibition.com.br for no charge. This information is not intended to replace advice given to you by your health care provider. Make sure you discuss any questions you have with your health care provider. Document Revised: 12/13/2022 Document Reviewed: 09/18/2022 Elsevier Patient Education  2024 Elsevier Inc.  Preventing Hypertension Hypertension, also called high blood pressure, is when the force of blood pumping through the arteries is too strong. Arteries are blood vessels that carry blood from the heart throughout the body. Often, hypertension does not cause symptoms until blood pressure is very high. It is important to have your blood pressure checked regularly. Diet and lifestyle changes can help you prevent hypertension, and they may make you feel better overall and improve your quality of life. If you already have hypertension, you may control it with diet and lifestyle changes, as well as with medicine. How can this condition affect me? Over time, hypertension can damage the arteries and decrease blood flow to important parts of the body, including the brain, heart, and kidneys. By keeping your blood pressure in a healthy range, you can help prevent complications like heart attack, heart failure, stroke, kidney failure, and vascular  dementia. What can increase my risk? An unhealthy diet and a lack of physical activity can make you more likely to develop high blood pressure. Some other risk factors include: Age. The risk increases with age. Having family members who have had high blood pressure. Having certain health conditions, such as thyroid problems. Being overweight or obese. Drinking too much alcohol or caffeine. Having too much fat, sugar, calories, or salt (sodium) in your diet. Smoking or using illegal drugs. Taking certain medicines, such as antidepressants, decongestants, birth control pills, and NSAIDs, such as ibuprofen. What actions can I take to prevent or manage this condition? Work with your health care provider to make a hypertension prevention plan that works for you. You may be referred for counseling on a healthy diet and physical activity. Follow your plan and keep all follow-up visits. Diet changes Maintain a healthy diet. This includes: Eating less salt (sodium). Ask your health care provider how much sodium is safe for you to have. The general recommendation is to have less than 1 tsp (2,300 mg) of sodium a day. Do not add salt to your food. Choose low-sodium options when grocery shopping and eating out. Limiting fats in your diet. You can do this by eating low-fat or fat-free dairy products and by eating less red meat. Eating more fruits, vegetables, and whole grains. Make a goal to eat: 1-2 cups of fresh fruits and vegetables each day. 3-4 servings of whole grains each day. Avoiding foods and beverages that have added sugars. Eating fish that contain healthy fats (omega-3 fatty acids), such as mackerel or salmon.  If you need help putting together a healthy eating plan, try the DASH diet. This diet is high in fruits, vegetables, and whole grains. It is low in sodium, red meat, and added sugars. DASH stands for Dietary Approaches to Stop Hypertension. Lifestyle changes  Lose weight if you are  overweight. Losing just 3-5% of your body weight can help prevent or control hypertension. For example, if your present weight is 200 lb (91 kg), a loss of 3-5% of your weight means losing 6-10 lb (2.7-4.5 kg). Ask your health care provider to help you with a diet and exercise plan to safely lose weight. Get enough exercise. Do at least 150 minutes of moderate-intensity exercise each week. You could do this in short exercise sessions several times a day, or you could do longer exercise sessions a few times a week. For example, you could take a brisk 10-minute walk or bike ride, 3 times a day, for 5 days a week. Find ways to reduce stress, such as exercising, meditating, listening to music, or taking a yoga class. If you need help reducing stress, ask your health care provider. Do not use any products that contain nicotine or tobacco. These products include cigarettes, chewing tobacco, and vaping devices, such as e-cigarettes. Chemicals in tobacco and nicotine products raise your blood pressure each time you use them. If you need help quitting, ask your health care provider. Learn how to check your blood pressure at home. Make sure that you know your personal target blood pressure, as told by your health care provider. Try to sleep 7-9 hours per night. Alcohol use Do not drink alcohol if: Your health care provider tells you not to drink. You are pregnant, may be pregnant, or are planning to become pregnant. If you drink alcohol: Limit how much you have to: 0-1 drink a day for women. 0-2 drinks a day for men. Know how much alcohol is in your drink. In the [place] one drink equals one 12 oz bottle of beer (355 mL), one 5 oz glass of wine (148 mL), or one 1 oz glass of hard liquor (44 mL). Medicines In addition to diet and lifestyle changes, your health care provider may recommend medicines to help lower your blood pressure. In general: You may need to try a few different medicines to find what works  best for you. You may need to take more than one medicine. Take over-the-counter and prescription medicines only as told by your health care provider. Questions to ask your health care provider What is my blood pressure goal? How can I lower my risk for high blood pressure? How should I monitor my blood pressure at home? Where to find support Your health care provider can help you prevent hypertension and help you keep your blood pressure at a healthy level. Your local hospital or your community may also provide support services and prevention programs. The American Heart Association offers an online support network at supportnetwork.heart.org Where to find more information Learn more about hypertension from: National Heart, Lung, and Blood Institute: PopSteam.is Centers for Disease Control and Prevention: FootballExhibition.com.br American Academy of Family Physicians: familydoctor.org Learn more about the DASH diet from: National Heart, Lung, and Blood Institute: PopSteam.is Contact a health care provider if: You think you are having a reaction to medicines you have taken. You have recurrent headaches or feel dizzy. You have swelling in your ankles. You have trouble with your vision. Get help right away if: You have sudden, severe chest, back, or abdominal  pain or discomfort. You have shortness of breath. You have a sudden, severe headache. These symptoms may be an emergency. Get help right away. Call 911. Do not wait to see if the symptoms will go away. Do not drive yourself to the hospital. Summary Hypertension often does not cause any symptoms until blood pressure is very high. It is important to get your blood pressure checked regularly. Diet and lifestyle changes are important steps in preventing hypertension. By keeping your blood pressure in a healthy range, you may prevent complications like heart attack, heart failure, stroke, and kidney failure. Work with your health care  provider to make a hypertension prevention plan that works for you. This information is not intended to replace advice given to you by your health care provider. Make sure you discuss any questions you have with your health care provider. Document Revised: 06/16/2021 Document Reviewed: 06/16/2021 Elsevier Patient Education  2024 [employer].

## 2024-01-04 ENCOUNTER — Ambulatory Visit: Payer: 59

## 2024-01-04 VITALS — BP 129/89 | HR 97 | Ht 62.0 in | Wt 192.0 lb

## 2024-01-04 DIAGNOSIS — Z23 Encounter for immunization: Secondary | ICD-10-CM | POA: Diagnosis not present

## 2024-01-04 NOTE — Progress Notes (Signed)
    NURSE VISIT NOTE  Subjective:    Patient ID: Mary Schwartz, female    DOB: Dec 26, 1995, 28 y.o.   MRN: [id number]  HPI  Patient is a 28 y.o. G26P2002 female who presents for her third Gardasil injection. Order to administer given by Sofia Dunn, MD on 08/31/2023.   Objective:    BP 129/89   Pulse 97   Ht 5\' 2"  (1.575 m)   Wt 192 lb (87.1 kg)   BMI 35.12 kg/m   Given by: Ila Malay, CMA Site:  right deltoid     Assessment:   1. Need for HPV vaccine      Plan:   Patient will return in prn. Gardasil series complete.     Mary Schwartz, CMA

## 2024-07-01 ENCOUNTER — Telehealth: Admitting: Physician Assistant

## 2024-07-01 DIAGNOSIS — M545 Low back pain, unspecified: Secondary | ICD-10-CM

## 2024-07-02 ENCOUNTER — Ambulatory Visit
Admission: RE | Admit: 2024-07-02 | Discharge: 2024-07-02 | Disposition: A | Discharge status: Home / Self Care | Attending: Emergency Medicine | Admitting: Emergency Medicine

## 2024-07-02 VITALS — BP 137/85 | HR 102 | Temp 98.0°F | Resp 18

## 2024-07-02 DIAGNOSIS — M5441 Lumbago with sciatica, right side: Secondary | ICD-10-CM | POA: Diagnosis not present

## 2024-07-02 LAB — POCT URINE DIPSTICK
Bilirubin, UA: NEGATIVE
Blood, UA: NEGATIVE
Glucose, UA: NEGATIVE mg/dL
Ketones, POC UA: NEGATIVE mg/dL
Nitrite, UA: NEGATIVE
POC PROTEIN,UA: 30 — AB
Spec Grav, UA: >=1.03 — AB (ref 1.010–1.025)
Urobilinogen, UA: 0.2 U/dL
pH, UA: 6 (ref 5.0–8.0)

## 2024-07-02 LAB — POCT URINE PREGNANCY: Preg Test, Ur: NEGATIVE

## 2024-07-02 MED ORDER — METHOCARBAMOL 500 MG PO TABS: 500.0000 mg | ORAL_TABLET | Freq: Two times a day (BID) | ORAL | 0 refills | Status: AC | PRN

## 2024-07-02 NOTE — Progress Notes (Signed)
  Because of severity of back pain and radiation to abdomen/groin and need for examination, I feel your condition warrants further evaluation and I recommend that you be seen in a face-to-face visit.   NOTE: There will be NO CHARGE for this E-Visit   If you are having a true medical emergency, please call 911.     For an urgent face to face visit, [place] has multiple urgent care centers for your convenience.  Click the link below for the full list of locations and hours, walk-in wait times, appointment scheduling options and driving directions:  Urgent Care - [place], [place], [place], [place], [place], [place]  [place]     Your MyChart E-visit questionnaire answers were reviewed by a board certified advanced clinical practitioner to complete your personal care plan based on your specific symptoms.    Thank you for using e-Visits.

## 2024-07-02 NOTE — Discharge Instructions (Addendum)
Take ibuprofen as needed for discomfort.  Take the muscle relaxer as needed for muscle spasm; Do not drive, operate machinery, or drink alcohol with this medication as it can cause drowsiness.   Follow up with your primary care provider or an orthopedist if your symptoms are not improving.

## 2024-07-02 NOTE — ED Provider Notes (Signed)
 Mary Schwartz    CSN: [id number] Arrival date & time: 07/02/24  1545      History   Chief Complaint Chief Complaint  Patient presents with   Back Pain    Entered by patient    HPI Mary Schwartz is a 28 y.o. female.  Patient presents with 5-day history of right lower back pain which radiates down her right leg.  No falls or injury.  The pain is worse with turning and twisting; improves with ice pack.  No numbness, weakness, saddle anesthesia, loss of bowel/bladder control, fever, chills, abdominal pain, hematuria, dysuria, vaginal discharge, pelvic pain.  She has been treating her symptoms with Tylenol  and ibuprofen .  Patient has history of lumbar radiculopathy and chronic back pain.  The history is provided by the patient and medical records.    Past Medical History:  Diagnosis Date   Allergy    Anemia    Anxiety    BRCA negative 01/2022   MyRisk neg except BAP1 and MSH2 VUS; IBIS=15.6%/riskscore=15.0%   Depression    Family history of breast cancer    Migraine with aura and without status migrainosus, not intractable 12/14/2021    Patient Active Problem List   Diagnosis Date Noted   Family history of breast cancer 12/29/2021   Chronic midline thoracic back pain 12/14/2021   Anxiety and depression 12/14/2021    Past Surgical History:  Procedure Laterality Date   NO PAST SURGERIES      OB History     Gravida  2   Para  2   Term  2   Preterm      AB      Living  2      SAB      IAB      Ectopic      Multiple  0   Live Births  2            Home Medications    Prior to Admission medications   Medication Sig Start Date End Date Taking? Authorizing Provider  methocarbamol (ROBAXIN) 500 MG tablet Take 1 tablet (500 mg total) by mouth 2 (two) times daily as needed for muscle spasms. 07/02/24  Yes Corlis Burnard DEL, NP  norethindrone -ethinyl estradiol-FE (JUNEL FE 1/20) 1-20 MG-MCG tablet Take 1 tablet by mouth daily. 06/25/23   Leigh Sober, MD    Family History Family History  Problem Relation Age of Onset   Hypertension Mother    Asthma Father    Breast cancer Maternal Grandmother        31s, twice   Hypertension Maternal Grandmother    Migraines Neg Hx     Social History Social History   Tobacco Use   Smoking status: Never   Smokeless tobacco: Never  Vaping Use   Vaping status: Never Used  Substance Use Topics   Alcohol use: No   Drug use: No     Allergies   Patient has no known allergies.   Review of Systems Review of Systems  Constitutional:  Negative for chills and fever.  Gastrointestinal:  Negative for abdominal pain, blood in stool, constipation, diarrhea, nausea and vomiting.  Genitourinary:  Negative for dysuria, flank pain, hematuria, pelvic pain and vaginal discharge.  Musculoskeletal:  Positive for back pain. Negative for gait problem.  Skin:  Negative for color change, rash and wound.  Neurological:  Negative for weakness and numbness.     Physical Exam Triage Vital Signs ED Triage Vitals  Encounter  Vitals Group     BP      Girls Systolic BP Percentile      Girls Diastolic BP Percentile      Boys Systolic BP Percentile      Boys Diastolic BP Percentile      Pulse      Resp      Temp      Temp src      SpO2      Weight      Height      Head Circumference      Peak Flow      Pain Score      Pain Loc      Pain Education      Exclude from Growth Chart    No data found.  Updated Vital Signs BP 137/85   Pulse (!) 102   Temp 98 F (36.7 C)   Resp 18   SpO2 98%   Visual Acuity Right Eye Distance:   Left Eye Distance:   Bilateral Distance:    Right Eye Near:   Left Eye Near:    Bilateral Near:     Physical Exam Constitutional:      General: She is not in acute distress. HENT:     Mouth/Throat:     Mouth: Mucous membranes are moist.  Cardiovascular:     Rate and Rhythm: Normal rate and regular rhythm.     Heart sounds: Normal heart sounds.   Pulmonary:     Effort: Pulmonary effort is normal. No respiratory distress.     Breath sounds: Normal breath sounds.  Abdominal:     General: Bowel sounds are normal.     Palpations: Abdomen is soft.     Tenderness: There is no abdominal tenderness. There is no right CVA tenderness, left CVA tenderness, guarding or rebound.  Musculoskeletal:        General: No swelling, tenderness or deformity. Normal range of motion.  Skin:    General: Skin is warm and dry.     Capillary Refill: Capillary refill takes less than 2 seconds.     Findings: No bruising, erythema, lesion or rash.  Neurological:     General: No focal deficit present.     Mental Status: She is alert.     Sensory: No sensory deficit.     Motor: No weakness.     Gait: Gait normal.      UC Treatments / Results  Labs (all labs ordered are listed, but only abnormal results are displayed) Labs Reviewed  POCT URINE DIPSTICK - Abnormal; Notable for the following components:      Result Value   Spec Grav, UA >=1.030 (*)    POC PROTEIN,UA =30 (*)    Leukocytes, UA Trace (*)    All other components within normal limits  POCT URINE PREGNANCY    EKG   Radiology No results found.  Procedures Procedures (including critical care time)  Medications Ordered in UC Medications - No data to display  Initial Impression / Assessment and Plan / UC Course  I have reviewed the triage vital signs and the nursing notes.  Pertinent labs & imaging results that were available during my care of the patient were reviewed by me and considered in my medical decision making (see chart for details).    Right low back pain with right side sciatica.  Afebrile and vital signs are stable.  No trauma.  Treating today with ibuprofen  and methocarbamol.  Precautions for drowsiness  with methocarbamol discussed.  Instructed patient to follow-up with her orthopedist if she is not improving.  She has an established relationship with [facility] and  would prefer to go back to them.  Education provided on sciatica.  ED precautions given.  Patient agrees to plan of care.  Final Clinical Impressions(s) / UC Diagnoses   Final diagnoses:  Acute right-sided low back pain with right-sided sciatica     Discharge Instructions      Take ibuprofen  as needed for discomfort.  Take the muscle relaxer as needed for muscle spasm; Do not drive, operate machinery, or drink alcohol with this medication as it can cause drowsiness.   Follow up with your primary care provider or an orthopedist if your symptoms are not improving.       ED Prescriptions     Medication Sig Dispense Auth. Provider   methocarbamol (ROBAXIN) 500 MG tablet Take 1 tablet (500 mg total) by mouth 2 (two) times daily as needed for muscle spasms. 10 tablet Corlis Burnard DEL, NP      PDMP not reviewed this encounter.   Corlis Burnard DEL, NP 07/02/24 [phone]

## 2024-07-26 ENCOUNTER — Other Ambulatory Visit: Payer: Self-pay | Admitting: Obstetrics

## 2024-07-28 ENCOUNTER — Other Ambulatory Visit: Payer: Self-pay

## 2024-07-28 DIAGNOSIS — Z3041 Encounter for surveillance of contraceptive pills: Secondary | ICD-10-CM

## 2024-07-28 MED ORDER — JUNEL FE 1/20 1-20 MG-MCG PO TABS: 1.0000 | ORAL_TABLET | Freq: Every day | ORAL | 0 refills | Status: DC

## 2024-07-28 NOTE — Progress Notes (Unsigned)
 Annual exam scheduled for 08/01/24.  One refill authorized.

## 2024-08-01 ENCOUNTER — Ambulatory Visit: Admitting: Licensed Practical Nurse

## 2024-10-13 ENCOUNTER — Other Ambulatory Visit: Payer: Self-pay | Admitting: Obstetrics

## 2024-10-13 DIAGNOSIS — Z3041 Encounter for surveillance of contraceptive pills: Secondary | ICD-10-CM

## 2024-10-24 ENCOUNTER — Ambulatory Visit: Admitting: Licensed Practical Nurse
